# Patient Record
Sex: Male | Born: 1945 | Race: White | Marital: Single | Smoking: Former smoker
Health system: Southern US, Community
[De-identification: ages and names within clinical notes are randomized; demographics above are authoritative.]

## PROBLEM LIST (undated history)

## (undated) DIAGNOSIS — J45909 Unspecified asthma, uncomplicated: Secondary | ICD-10-CM

## (undated) DIAGNOSIS — K219 Gastro-esophageal reflux disease without esophagitis: Secondary | ICD-10-CM

## (undated) DIAGNOSIS — C4490 Unspecified malignant neoplasm of skin, unspecified: Secondary | ICD-10-CM

## (undated) DIAGNOSIS — E785 Hyperlipidemia, unspecified: Secondary | ICD-10-CM

## (undated) DIAGNOSIS — Z923 Personal history of irradiation: Secondary | ICD-10-CM

## (undated) DIAGNOSIS — R000 Tachycardia, unspecified: Secondary | ICD-10-CM

## (undated) DIAGNOSIS — C61 Malignant neoplasm of prostate: Secondary | ICD-10-CM

## (undated) DIAGNOSIS — J439 Emphysema, unspecified: Secondary | ICD-10-CM

## (undated) DIAGNOSIS — R0600 Dyspnea, unspecified: Secondary | ICD-10-CM

## (undated) DIAGNOSIS — M1990 Unspecified osteoarthritis, unspecified site: Secondary | ICD-10-CM

## (undated) HISTORY — PX: CLAVICLE SURGERY: SHX598

## (undated) HISTORY — PX: LEG SURGERY: SHX1003

## (undated) HISTORY — PX: PROSTATE BIOPSY: SHX241

## (undated) HISTORY — PX: APPENDECTOMY: SHX54

## (undated) HISTORY — PX: SKIN CANCER EXCISION: SHX779

## (undated) HISTORY — PX: EYE SURGERY: SHX253

---

## 2012-08-14 DIAGNOSIS — Z7982 Long term (current) use of aspirin: Secondary | ICD-10-CM | POA: Diagnosis not present

## 2012-08-14 DIAGNOSIS — Z79899 Other long term (current) drug therapy: Secondary | ICD-10-CM | POA: Diagnosis not present

## 2012-08-14 DIAGNOSIS — G4730 Sleep apnea, unspecified: Secondary | ICD-10-CM | POA: Diagnosis not present

## 2012-08-14 DIAGNOSIS — M25569 Pain in unspecified knee: Secondary | ICD-10-CM | POA: Diagnosis not present

## 2012-08-14 DIAGNOSIS — S82109A Unspecified fracture of upper end of unspecified tibia, initial encounter for closed fracture: Secondary | ICD-10-CM | POA: Diagnosis not present

## 2012-08-14 DIAGNOSIS — J45909 Unspecified asthma, uncomplicated: Secondary | ICD-10-CM | POA: Diagnosis not present

## 2012-08-14 DIAGNOSIS — S82209A Unspecified fracture of shaft of unspecified tibia, initial encounter for closed fracture: Secondary | ICD-10-CM | POA: Diagnosis not present

## 2012-08-14 DIAGNOSIS — M79609 Pain in unspecified limb: Secondary | ICD-10-CM | POA: Diagnosis not present

## 2012-08-14 DIAGNOSIS — K219 Gastro-esophageal reflux disease without esophagitis: Secondary | ICD-10-CM | POA: Diagnosis not present

## 2012-08-14 DIAGNOSIS — W1789XA Other fall from one level to another, initial encounter: Secondary | ICD-10-CM | POA: Diagnosis not present

## 2012-08-29 DIAGNOSIS — M25469 Effusion, unspecified knee: Secondary | ICD-10-CM | POA: Diagnosis not present

## 2012-08-29 DIAGNOSIS — S82109A Unspecified fracture of upper end of unspecified tibia, initial encounter for closed fracture: Secondary | ICD-10-CM | POA: Diagnosis not present

## 2012-08-29 DIAGNOSIS — R609 Edema, unspecified: Secondary | ICD-10-CM | POA: Diagnosis not present

## 2012-08-29 DIAGNOSIS — S82209A Unspecified fracture of shaft of unspecified tibia, initial encounter for closed fracture: Secondary | ICD-10-CM | POA: Diagnosis not present

## 2012-08-29 DIAGNOSIS — M25569 Pain in unspecified knee: Secondary | ICD-10-CM | POA: Diagnosis not present

## 2012-08-30 DIAGNOSIS — M79609 Pain in unspecified limb: Secondary | ICD-10-CM | POA: Diagnosis not present

## 2012-09-06 DIAGNOSIS — M79609 Pain in unspecified limb: Secondary | ICD-10-CM | POA: Diagnosis not present

## 2012-09-13 DIAGNOSIS — M79609 Pain in unspecified limb: Secondary | ICD-10-CM | POA: Diagnosis not present

## 2012-09-20 DIAGNOSIS — M79609 Pain in unspecified limb: Secondary | ICD-10-CM | POA: Diagnosis not present

## 2012-09-26 DIAGNOSIS — M79609 Pain in unspecified limb: Secondary | ICD-10-CM | POA: Diagnosis not present

## 2012-10-02 DIAGNOSIS — M79609 Pain in unspecified limb: Secondary | ICD-10-CM | POA: Diagnosis not present

## 2012-10-17 DIAGNOSIS — S82209A Unspecified fracture of shaft of unspecified tibia, initial encounter for closed fracture: Secondary | ICD-10-CM | POA: Diagnosis not present

## 2012-10-17 DIAGNOSIS — M79609 Pain in unspecified limb: Secondary | ICD-10-CM | POA: Diagnosis not present

## 2012-10-23 DIAGNOSIS — M79609 Pain in unspecified limb: Secondary | ICD-10-CM | POA: Diagnosis not present

## 2012-10-25 DIAGNOSIS — M79609 Pain in unspecified limb: Secondary | ICD-10-CM | POA: Diagnosis not present

## 2014-02-25 DIAGNOSIS — Z86010 Personal history of colonic polyps: Secondary | ICD-10-CM | POA: Diagnosis not present

## 2014-02-25 DIAGNOSIS — K219 Gastro-esophageal reflux disease without esophagitis: Secondary | ICD-10-CM | POA: Diagnosis not present

## 2014-03-21 DIAGNOSIS — D126 Benign neoplasm of colon, unspecified: Secondary | ICD-10-CM | POA: Diagnosis not present

## 2014-03-21 DIAGNOSIS — K5730 Diverticulosis of large intestine without perforation or abscess without bleeding: Secondary | ICD-10-CM | POA: Diagnosis not present

## 2014-03-21 DIAGNOSIS — Z86010 Personal history of colonic polyps: Secondary | ICD-10-CM | POA: Diagnosis not present

## 2014-09-08 DIAGNOSIS — IMO0002 Reserved for concepts with insufficient information to code with codable children: Secondary | ICD-10-CM | POA: Diagnosis not present

## 2014-09-08 DIAGNOSIS — S335XXA Sprain of ligaments of lumbar spine, initial encounter: Secondary | ICD-10-CM | POA: Diagnosis not present

## 2015-02-19 DIAGNOSIS — H81399 Other peripheral vertigo, unspecified ear: Secondary | ICD-10-CM | POA: Diagnosis not present

## 2015-04-02 DIAGNOSIS — L570 Actinic keratosis: Secondary | ICD-10-CM | POA: Diagnosis not present

## 2015-04-02 DIAGNOSIS — L719 Rosacea, unspecified: Secondary | ICD-10-CM | POA: Diagnosis not present

## 2015-04-02 DIAGNOSIS — D044 Carcinoma in situ of skin of scalp and neck: Secondary | ICD-10-CM | POA: Diagnosis not present

## 2015-10-29 DIAGNOSIS — L570 Actinic keratosis: Secondary | ICD-10-CM | POA: Diagnosis not present

## 2015-10-29 DIAGNOSIS — L821 Other seborrheic keratosis: Secondary | ICD-10-CM | POA: Diagnosis not present

## 2015-12-23 DIAGNOSIS — J209 Acute bronchitis, unspecified: Secondary | ICD-10-CM | POA: Diagnosis not present

## 2015-12-23 DIAGNOSIS — J019 Acute sinusitis, unspecified: Secondary | ICD-10-CM | POA: Diagnosis not present

## 2016-01-14 DIAGNOSIS — Z1389 Encounter for screening for other disorder: Secondary | ICD-10-CM | POA: Diagnosis not present

## 2016-01-14 DIAGNOSIS — R030 Elevated blood-pressure reading, without diagnosis of hypertension: Secondary | ICD-10-CM | POA: Diagnosis not present

## 2016-01-14 DIAGNOSIS — Z6826 Body mass index (BMI) 26.0-26.9, adult: Secondary | ICD-10-CM | POA: Diagnosis not present

## 2016-01-14 DIAGNOSIS — Z9181 History of falling: Secondary | ICD-10-CM | POA: Diagnosis not present

## 2016-01-14 DIAGNOSIS — R1030 Lower abdominal pain, unspecified: Secondary | ICD-10-CM | POA: Diagnosis not present

## 2016-01-29 DIAGNOSIS — R030 Elevated blood-pressure reading, without diagnosis of hypertension: Secondary | ICD-10-CM | POA: Diagnosis not present

## 2016-01-29 DIAGNOSIS — Z1389 Encounter for screening for other disorder: Secondary | ICD-10-CM | POA: Diagnosis not present

## 2016-01-29 DIAGNOSIS — Z125 Encounter for screening for malignant neoplasm of prostate: Secondary | ICD-10-CM | POA: Diagnosis not present

## 2016-01-29 DIAGNOSIS — Z0000 Encounter for general adult medical examination without abnormal findings: Secondary | ICD-10-CM | POA: Diagnosis not present

## 2016-01-29 DIAGNOSIS — Z6826 Body mass index (BMI) 26.0-26.9, adult: Secondary | ICD-10-CM | POA: Diagnosis not present

## 2016-01-29 DIAGNOSIS — E785 Hyperlipidemia, unspecified: Secondary | ICD-10-CM | POA: Diagnosis not present

## 2016-01-29 DIAGNOSIS — Z23 Encounter for immunization: Secondary | ICD-10-CM | POA: Diagnosis not present

## 2016-02-12 DIAGNOSIS — R972 Elevated prostate specific antigen [PSA]: Secondary | ICD-10-CM | POA: Diagnosis not present

## 2016-02-12 DIAGNOSIS — Z8042 Family history of malignant neoplasm of prostate: Secondary | ICD-10-CM | POA: Diagnosis not present

## 2016-02-12 DIAGNOSIS — N401 Enlarged prostate with lower urinary tract symptoms: Secondary | ICD-10-CM | POA: Diagnosis not present

## 2016-02-13 DIAGNOSIS — R51 Headache: Secondary | ICD-10-CM | POA: Diagnosis not present

## 2016-02-13 DIAGNOSIS — H81399 Other peripheral vertigo, unspecified ear: Secondary | ICD-10-CM | POA: Diagnosis not present

## 2016-02-16 DIAGNOSIS — R42 Dizziness and giddiness: Secondary | ICD-10-CM | POA: Diagnosis not present

## 2016-02-16 DIAGNOSIS — G3189 Other specified degenerative diseases of nervous system: Secondary | ICD-10-CM | POA: Diagnosis not present

## 2016-02-16 DIAGNOSIS — J0100 Acute maxillary sinusitis, unspecified: Secondary | ICD-10-CM | POA: Diagnosis not present

## 2016-02-16 DIAGNOSIS — R51 Headache: Secondary | ICD-10-CM | POA: Diagnosis not present

## 2016-02-18 DIAGNOSIS — Z6827 Body mass index (BMI) 27.0-27.9, adult: Secondary | ICD-10-CM | POA: Diagnosis not present

## 2016-02-18 DIAGNOSIS — R42 Dizziness and giddiness: Secondary | ICD-10-CM | POA: Diagnosis not present

## 2016-02-23 DIAGNOSIS — R42 Dizziness and giddiness: Secondary | ICD-10-CM | POA: Diagnosis not present

## 2016-03-17 DIAGNOSIS — N401 Enlarged prostate with lower urinary tract symptoms: Secondary | ICD-10-CM | POA: Diagnosis not present

## 2016-03-17 DIAGNOSIS — R972 Elevated prostate specific antigen [PSA]: Secondary | ICD-10-CM | POA: Diagnosis not present

## 2016-03-30 DIAGNOSIS — J111 Influenza due to unidentified influenza virus with other respiratory manifestations: Secondary | ICD-10-CM | POA: Diagnosis not present

## 2016-04-28 DIAGNOSIS — L570 Actinic keratosis: Secondary | ICD-10-CM | POA: Diagnosis not present

## 2016-04-28 DIAGNOSIS — D485 Neoplasm of uncertain behavior of skin: Secondary | ICD-10-CM | POA: Diagnosis not present

## 2016-07-18 DIAGNOSIS — N401 Enlarged prostate with lower urinary tract symptoms: Secondary | ICD-10-CM | POA: Diagnosis not present

## 2016-07-18 DIAGNOSIS — R972 Elevated prostate specific antigen [PSA]: Secondary | ICD-10-CM | POA: Diagnosis not present

## 2016-10-14 DIAGNOSIS — S2241XA Multiple fractures of ribs, right side, initial encounter for closed fracture: Secondary | ICD-10-CM | POA: Diagnosis not present

## 2016-10-14 DIAGNOSIS — T148XXA Other injury of unspecified body region, initial encounter: Secondary | ICD-10-CM | POA: Diagnosis not present

## 2016-10-14 DIAGNOSIS — S42001A Fracture of unspecified part of right clavicle, initial encounter for closed fracture: Secondary | ICD-10-CM | POA: Diagnosis not present

## 2016-10-14 DIAGNOSIS — S42021A Displaced fracture of shaft of right clavicle, initial encounter for closed fracture: Secondary | ICD-10-CM | POA: Diagnosis not present

## 2016-10-24 DIAGNOSIS — S42001A Fracture of unspecified part of right clavicle, initial encounter for closed fracture: Secondary | ICD-10-CM | POA: Diagnosis not present

## 2016-10-26 DIAGNOSIS — E663 Overweight: Secondary | ICD-10-CM | POA: Diagnosis not present

## 2016-10-26 DIAGNOSIS — Z6827 Body mass index (BMI) 27.0-27.9, adult: Secondary | ICD-10-CM | POA: Diagnosis not present

## 2016-10-26 DIAGNOSIS — Z01810 Encounter for preprocedural cardiovascular examination: Secondary | ICD-10-CM | POA: Diagnosis not present

## 2016-10-26 DIAGNOSIS — E785 Hyperlipidemia, unspecified: Secondary | ICD-10-CM | POA: Diagnosis not present

## 2016-10-26 DIAGNOSIS — S2241XB Multiple fractures of ribs, right side, initial encounter for open fracture: Secondary | ICD-10-CM | POA: Diagnosis not present

## 2016-10-26 DIAGNOSIS — S42001A Fracture of unspecified part of right clavicle, initial encounter for closed fracture: Secondary | ICD-10-CM | POA: Diagnosis not present

## 2016-11-03 DIAGNOSIS — J45909 Unspecified asthma, uncomplicated: Secondary | ICD-10-CM | POA: Diagnosis not present

## 2016-11-03 DIAGNOSIS — M65312 Trigger thumb, left thumb: Secondary | ICD-10-CM | POA: Diagnosis not present

## 2016-11-03 DIAGNOSIS — K219 Gastro-esophageal reflux disease without esophagitis: Secondary | ICD-10-CM | POA: Diagnosis not present

## 2016-11-03 DIAGNOSIS — S42001A Fracture of unspecified part of right clavicle, initial encounter for closed fracture: Secondary | ICD-10-CM | POA: Diagnosis not present

## 2016-11-03 DIAGNOSIS — Z87891 Personal history of nicotine dependence: Secondary | ICD-10-CM | POA: Diagnosis not present

## 2016-11-03 DIAGNOSIS — Z01810 Encounter for preprocedural cardiovascular examination: Secondary | ICD-10-CM | POA: Diagnosis not present

## 2016-11-03 DIAGNOSIS — Z79899 Other long term (current) drug therapy: Secondary | ICD-10-CM | POA: Diagnosis not present

## 2016-11-03 DIAGNOSIS — G8918 Other acute postprocedural pain: Secondary | ICD-10-CM | POA: Diagnosis not present

## 2016-11-03 DIAGNOSIS — S42021A Displaced fracture of shaft of right clavicle, initial encounter for closed fracture: Secondary | ICD-10-CM | POA: Diagnosis not present

## 2016-11-03 DIAGNOSIS — G4733 Obstructive sleep apnea (adult) (pediatric): Secondary | ICD-10-CM | POA: Diagnosis not present

## 2016-11-21 DIAGNOSIS — S42001D Fracture of unspecified part of right clavicle, subsequent encounter for fracture with routine healing: Secondary | ICD-10-CM | POA: Diagnosis not present

## 2016-12-05 DIAGNOSIS — S42001D Fracture of unspecified part of right clavicle, subsequent encounter for fracture with routine healing: Secondary | ICD-10-CM | POA: Diagnosis not present

## 2016-12-08 DIAGNOSIS — M79621 Pain in right upper arm: Secondary | ICD-10-CM | POA: Diagnosis not present

## 2016-12-20 DIAGNOSIS — M25511 Pain in right shoulder: Secondary | ICD-10-CM | POA: Diagnosis not present

## 2016-12-20 DIAGNOSIS — M25611 Stiffness of right shoulder, not elsewhere classified: Secondary | ICD-10-CM | POA: Diagnosis not present

## 2017-01-02 DIAGNOSIS — M25611 Stiffness of right shoulder, not elsewhere classified: Secondary | ICD-10-CM | POA: Diagnosis not present

## 2017-01-02 DIAGNOSIS — M25511 Pain in right shoulder: Secondary | ICD-10-CM | POA: Diagnosis not present

## 2017-01-04 DIAGNOSIS — M25511 Pain in right shoulder: Secondary | ICD-10-CM | POA: Diagnosis not present

## 2017-01-04 DIAGNOSIS — M25611 Stiffness of right shoulder, not elsewhere classified: Secondary | ICD-10-CM | POA: Diagnosis not present

## 2017-01-05 DIAGNOSIS — M25511 Pain in right shoulder: Secondary | ICD-10-CM | POA: Diagnosis not present

## 2017-01-05 DIAGNOSIS — M25611 Stiffness of right shoulder, not elsewhere classified: Secondary | ICD-10-CM | POA: Diagnosis not present

## 2017-01-06 DIAGNOSIS — S42001D Fracture of unspecified part of right clavicle, subsequent encounter for fracture with routine healing: Secondary | ICD-10-CM | POA: Diagnosis not present

## 2017-01-09 DIAGNOSIS — M25511 Pain in right shoulder: Secondary | ICD-10-CM | POA: Diagnosis not present

## 2017-01-09 DIAGNOSIS — M25611 Stiffness of right shoulder, not elsewhere classified: Secondary | ICD-10-CM | POA: Diagnosis not present

## 2017-01-16 DIAGNOSIS — M25511 Pain in right shoulder: Secondary | ICD-10-CM | POA: Diagnosis not present

## 2017-01-16 DIAGNOSIS — M25611 Stiffness of right shoulder, not elsewhere classified: Secondary | ICD-10-CM | POA: Diagnosis not present

## 2017-01-18 DIAGNOSIS — N401 Enlarged prostate with lower urinary tract symptoms: Secondary | ICD-10-CM | POA: Diagnosis not present

## 2017-01-18 DIAGNOSIS — R972 Elevated prostate specific antigen [PSA]: Secondary | ICD-10-CM | POA: Diagnosis not present

## 2017-01-20 DIAGNOSIS — M25611 Stiffness of right shoulder, not elsewhere classified: Secondary | ICD-10-CM | POA: Diagnosis not present

## 2017-01-20 DIAGNOSIS — M25511 Pain in right shoulder: Secondary | ICD-10-CM | POA: Diagnosis not present

## 2017-01-23 DIAGNOSIS — M25511 Pain in right shoulder: Secondary | ICD-10-CM | POA: Diagnosis not present

## 2017-01-23 DIAGNOSIS — M25611 Stiffness of right shoulder, not elsewhere classified: Secondary | ICD-10-CM | POA: Diagnosis not present

## 2017-01-25 DIAGNOSIS — M25611 Stiffness of right shoulder, not elsewhere classified: Secondary | ICD-10-CM | POA: Diagnosis not present

## 2017-01-25 DIAGNOSIS — M25511 Pain in right shoulder: Secondary | ICD-10-CM | POA: Diagnosis not present

## 2017-01-30 DIAGNOSIS — M25611 Stiffness of right shoulder, not elsewhere classified: Secondary | ICD-10-CM | POA: Diagnosis not present

## 2017-01-30 DIAGNOSIS — M25511 Pain in right shoulder: Secondary | ICD-10-CM | POA: Diagnosis not present

## 2017-02-01 DIAGNOSIS — M25611 Stiffness of right shoulder, not elsewhere classified: Secondary | ICD-10-CM | POA: Diagnosis not present

## 2017-02-01 DIAGNOSIS — M25511 Pain in right shoulder: Secondary | ICD-10-CM | POA: Diagnosis not present

## 2017-02-03 DIAGNOSIS — S42001D Fracture of unspecified part of right clavicle, subsequent encounter for fracture with routine healing: Secondary | ICD-10-CM | POA: Diagnosis not present

## 2017-02-08 DIAGNOSIS — M25511 Pain in right shoulder: Secondary | ICD-10-CM | POA: Diagnosis not present

## 2017-02-08 DIAGNOSIS — M25611 Stiffness of right shoulder, not elsewhere classified: Secondary | ICD-10-CM | POA: Diagnosis not present

## 2017-02-13 DIAGNOSIS — M25511 Pain in right shoulder: Secondary | ICD-10-CM | POA: Diagnosis not present

## 2017-02-13 DIAGNOSIS — M25611 Stiffness of right shoulder, not elsewhere classified: Secondary | ICD-10-CM | POA: Diagnosis not present

## 2017-02-24 DIAGNOSIS — S42001D Fracture of unspecified part of right clavicle, subsequent encounter for fracture with routine healing: Secondary | ICD-10-CM | POA: Diagnosis not present

## 2017-04-04 DIAGNOSIS — Z9181 History of falling: Secondary | ICD-10-CM | POA: Diagnosis not present

## 2017-04-04 DIAGNOSIS — Z1389 Encounter for screening for other disorder: Secondary | ICD-10-CM | POA: Diagnosis not present

## 2017-04-04 DIAGNOSIS — E785 Hyperlipidemia, unspecified: Secondary | ICD-10-CM | POA: Diagnosis not present

## 2017-04-04 DIAGNOSIS — Z0000 Encounter for general adult medical examination without abnormal findings: Secondary | ICD-10-CM | POA: Diagnosis not present

## 2017-04-04 DIAGNOSIS — Z136 Encounter for screening for cardiovascular disorders: Secondary | ICD-10-CM | POA: Diagnosis not present

## 2017-05-22 DIAGNOSIS — S20219A Contusion of unspecified front wall of thorax, initial encounter: Secondary | ICD-10-CM | POA: Diagnosis not present

## 2017-05-24 DIAGNOSIS — J61 Pneumoconiosis due to asbestos and other mineral fibers: Secondary | ICD-10-CM | POA: Diagnosis not present

## 2017-05-24 DIAGNOSIS — Z79899 Other long term (current) drug therapy: Secondary | ICD-10-CM | POA: Diagnosis not present

## 2017-05-24 DIAGNOSIS — R918 Other nonspecific abnormal finding of lung field: Secondary | ICD-10-CM | POA: Diagnosis not present

## 2017-05-26 DIAGNOSIS — R918 Other nonspecific abnormal finding of lung field: Secondary | ICD-10-CM | POA: Diagnosis not present

## 2017-05-26 DIAGNOSIS — K8020 Calculus of gallbladder without cholecystitis without obstruction: Secondary | ICD-10-CM | POA: Diagnosis not present

## 2017-05-26 DIAGNOSIS — K449 Diaphragmatic hernia without obstruction or gangrene: Secondary | ICD-10-CM | POA: Diagnosis not present

## 2017-07-19 DIAGNOSIS — R972 Elevated prostate specific antigen [PSA]: Secondary | ICD-10-CM | POA: Diagnosis not present

## 2017-07-19 DIAGNOSIS — N401 Enlarged prostate with lower urinary tract symptoms: Secondary | ICD-10-CM | POA: Diagnosis not present

## 2018-01-22 DIAGNOSIS — N401 Enlarged prostate with lower urinary tract symptoms: Secondary | ICD-10-CM | POA: Diagnosis not present

## 2018-01-22 DIAGNOSIS — R972 Elevated prostate specific antigen [PSA]: Secondary | ICD-10-CM | POA: Diagnosis not present

## 2018-02-26 DIAGNOSIS — N401 Enlarged prostate with lower urinary tract symptoms: Secondary | ICD-10-CM | POA: Diagnosis not present

## 2018-02-26 DIAGNOSIS — D075 Carcinoma in situ of prostate: Secondary | ICD-10-CM | POA: Diagnosis not present

## 2018-02-26 DIAGNOSIS — R972 Elevated prostate specific antigen [PSA]: Secondary | ICD-10-CM | POA: Diagnosis not present

## 2018-03-05 DIAGNOSIS — N401 Enlarged prostate with lower urinary tract symptoms: Secondary | ICD-10-CM | POA: Diagnosis not present

## 2018-03-05 DIAGNOSIS — R972 Elevated prostate specific antigen [PSA]: Secondary | ICD-10-CM | POA: Diagnosis not present

## 2018-03-15 DIAGNOSIS — H43811 Vitreous degeneration, right eye: Secondary | ICD-10-CM | POA: Diagnosis not present

## 2018-03-15 DIAGNOSIS — H35371 Puckering of macula, right eye: Secondary | ICD-10-CM | POA: Diagnosis not present

## 2018-03-15 DIAGNOSIS — H2513 Age-related nuclear cataract, bilateral: Secondary | ICD-10-CM | POA: Diagnosis not present

## 2018-04-03 DIAGNOSIS — L259 Unspecified contact dermatitis, unspecified cause: Secondary | ICD-10-CM | POA: Diagnosis not present

## 2018-04-05 DIAGNOSIS — Z48810 Encounter for surgical aftercare following surgery on the sense organs: Secondary | ICD-10-CM | POA: Diagnosis not present

## 2018-04-05 DIAGNOSIS — H35371 Puckering of macula, right eye: Secondary | ICD-10-CM | POA: Diagnosis not present

## 2018-04-05 DIAGNOSIS — H43811 Vitreous degeneration, right eye: Secondary | ICD-10-CM | POA: Diagnosis not present

## 2018-06-18 DIAGNOSIS — R972 Elevated prostate specific antigen [PSA]: Secondary | ICD-10-CM | POA: Diagnosis not present

## 2018-06-18 DIAGNOSIS — N401 Enlarged prostate with lower urinary tract symptoms: Secondary | ICD-10-CM | POA: Diagnosis not present

## 2018-07-25 ENCOUNTER — Other Ambulatory Visit: Payer: Self-pay

## 2018-08-13 DIAGNOSIS — Z1331 Encounter for screening for depression: Secondary | ICD-10-CM | POA: Diagnosis not present

## 2018-08-13 DIAGNOSIS — Z125 Encounter for screening for malignant neoplasm of prostate: Secondary | ICD-10-CM | POA: Diagnosis not present

## 2018-08-13 DIAGNOSIS — Z9181 History of falling: Secondary | ICD-10-CM | POA: Diagnosis not present

## 2018-08-13 DIAGNOSIS — E785 Hyperlipidemia, unspecified: Secondary | ICD-10-CM | POA: Diagnosis not present

## 2018-08-13 DIAGNOSIS — Z136 Encounter for screening for cardiovascular disorders: Secondary | ICD-10-CM | POA: Diagnosis not present

## 2018-08-13 DIAGNOSIS — Z0000 Encounter for general adult medical examination without abnormal findings: Secondary | ICD-10-CM | POA: Diagnosis not present

## 2018-10-25 DIAGNOSIS — M9902 Segmental and somatic dysfunction of thoracic region: Secondary | ICD-10-CM | POA: Diagnosis not present

## 2018-10-25 DIAGNOSIS — M62830 Muscle spasm of back: Secondary | ICD-10-CM | POA: Diagnosis not present

## 2018-10-25 DIAGNOSIS — M9901 Segmental and somatic dysfunction of cervical region: Secondary | ICD-10-CM | POA: Diagnosis not present

## 2018-10-25 DIAGNOSIS — M5413 Radiculopathy, cervicothoracic region: Secondary | ICD-10-CM | POA: Diagnosis not present

## 2018-10-25 DIAGNOSIS — M62838 Other muscle spasm: Secondary | ICD-10-CM | POA: Diagnosis not present

## 2018-10-25 DIAGNOSIS — M50323 Other cervical disc degeneration at C6-C7 level: Secondary | ICD-10-CM | POA: Diagnosis not present

## 2018-10-25 DIAGNOSIS — M546 Pain in thoracic spine: Secondary | ICD-10-CM | POA: Diagnosis not present

## 2018-10-26 DIAGNOSIS — M5413 Radiculopathy, cervicothoracic region: Secondary | ICD-10-CM | POA: Diagnosis not present

## 2018-10-26 DIAGNOSIS — N401 Enlarged prostate with lower urinary tract symptoms: Secondary | ICD-10-CM | POA: Diagnosis not present

## 2018-10-26 DIAGNOSIS — R972 Elevated prostate specific antigen [PSA]: Secondary | ICD-10-CM | POA: Diagnosis not present

## 2018-10-26 DIAGNOSIS — M9902 Segmental and somatic dysfunction of thoracic region: Secondary | ICD-10-CM | POA: Diagnosis not present

## 2018-10-26 DIAGNOSIS — M546 Pain in thoracic spine: Secondary | ICD-10-CM | POA: Diagnosis not present

## 2018-10-26 DIAGNOSIS — M50323 Other cervical disc degeneration at C6-C7 level: Secondary | ICD-10-CM | POA: Diagnosis not present

## 2018-10-26 DIAGNOSIS — M62830 Muscle spasm of back: Secondary | ICD-10-CM | POA: Diagnosis not present

## 2018-10-26 DIAGNOSIS — M9901 Segmental and somatic dysfunction of cervical region: Secondary | ICD-10-CM | POA: Diagnosis not present

## 2018-10-31 DIAGNOSIS — M546 Pain in thoracic spine: Secondary | ICD-10-CM | POA: Diagnosis not present

## 2018-10-31 DIAGNOSIS — M5413 Radiculopathy, cervicothoracic region: Secondary | ICD-10-CM | POA: Diagnosis not present

## 2018-10-31 DIAGNOSIS — M9901 Segmental and somatic dysfunction of cervical region: Secondary | ICD-10-CM | POA: Diagnosis not present

## 2018-10-31 DIAGNOSIS — M62830 Muscle spasm of back: Secondary | ICD-10-CM | POA: Diagnosis not present

## 2018-10-31 DIAGNOSIS — M50323 Other cervical disc degeneration at C6-C7 level: Secondary | ICD-10-CM | POA: Diagnosis not present

## 2018-10-31 DIAGNOSIS — M9902 Segmental and somatic dysfunction of thoracic region: Secondary | ICD-10-CM | POA: Diagnosis not present

## 2018-11-01 DIAGNOSIS — M62830 Muscle spasm of back: Secondary | ICD-10-CM | POA: Diagnosis not present

## 2018-11-01 DIAGNOSIS — M546 Pain in thoracic spine: Secondary | ICD-10-CM | POA: Diagnosis not present

## 2018-11-01 DIAGNOSIS — M9901 Segmental and somatic dysfunction of cervical region: Secondary | ICD-10-CM | POA: Diagnosis not present

## 2018-11-01 DIAGNOSIS — M50323 Other cervical disc degeneration at C6-C7 level: Secondary | ICD-10-CM | POA: Diagnosis not present

## 2018-11-01 DIAGNOSIS — M5413 Radiculopathy, cervicothoracic region: Secondary | ICD-10-CM | POA: Diagnosis not present

## 2018-11-01 DIAGNOSIS — M9902 Segmental and somatic dysfunction of thoracic region: Secondary | ICD-10-CM | POA: Diagnosis not present

## 2018-11-02 DIAGNOSIS — M546 Pain in thoracic spine: Secondary | ICD-10-CM | POA: Diagnosis not present

## 2018-11-02 DIAGNOSIS — M50323 Other cervical disc degeneration at C6-C7 level: Secondary | ICD-10-CM | POA: Diagnosis not present

## 2018-11-02 DIAGNOSIS — M5413 Radiculopathy, cervicothoracic region: Secondary | ICD-10-CM | POA: Diagnosis not present

## 2018-11-02 DIAGNOSIS — M9902 Segmental and somatic dysfunction of thoracic region: Secondary | ICD-10-CM | POA: Diagnosis not present

## 2018-11-02 DIAGNOSIS — M62830 Muscle spasm of back: Secondary | ICD-10-CM | POA: Diagnosis not present

## 2018-11-02 DIAGNOSIS — M9901 Segmental and somatic dysfunction of cervical region: Secondary | ICD-10-CM | POA: Diagnosis not present

## 2018-11-05 DIAGNOSIS — M50323 Other cervical disc degeneration at C6-C7 level: Secondary | ICD-10-CM | POA: Diagnosis not present

## 2018-11-05 DIAGNOSIS — M62830 Muscle spasm of back: Secondary | ICD-10-CM | POA: Diagnosis not present

## 2018-11-05 DIAGNOSIS — M5413 Radiculopathy, cervicothoracic region: Secondary | ICD-10-CM | POA: Diagnosis not present

## 2018-11-05 DIAGNOSIS — M9902 Segmental and somatic dysfunction of thoracic region: Secondary | ICD-10-CM | POA: Diagnosis not present

## 2018-11-05 DIAGNOSIS — M546 Pain in thoracic spine: Secondary | ICD-10-CM | POA: Diagnosis not present

## 2018-11-05 DIAGNOSIS — M9901 Segmental and somatic dysfunction of cervical region: Secondary | ICD-10-CM | POA: Diagnosis not present

## 2018-11-07 DIAGNOSIS — M5413 Radiculopathy, cervicothoracic region: Secondary | ICD-10-CM | POA: Diagnosis not present

## 2018-11-07 DIAGNOSIS — M62830 Muscle spasm of back: Secondary | ICD-10-CM | POA: Diagnosis not present

## 2018-11-07 DIAGNOSIS — M50323 Other cervical disc degeneration at C6-C7 level: Secondary | ICD-10-CM | POA: Diagnosis not present

## 2018-11-07 DIAGNOSIS — M546 Pain in thoracic spine: Secondary | ICD-10-CM | POA: Diagnosis not present

## 2018-11-07 DIAGNOSIS — M9902 Segmental and somatic dysfunction of thoracic region: Secondary | ICD-10-CM | POA: Diagnosis not present

## 2018-11-07 DIAGNOSIS — M9901 Segmental and somatic dysfunction of cervical region: Secondary | ICD-10-CM | POA: Diagnosis not present

## 2018-11-08 DIAGNOSIS — M9901 Segmental and somatic dysfunction of cervical region: Secondary | ICD-10-CM | POA: Diagnosis not present

## 2018-11-08 DIAGNOSIS — M50323 Other cervical disc degeneration at C6-C7 level: Secondary | ICD-10-CM | POA: Diagnosis not present

## 2018-11-08 DIAGNOSIS — M62830 Muscle spasm of back: Secondary | ICD-10-CM | POA: Diagnosis not present

## 2018-11-08 DIAGNOSIS — M5413 Radiculopathy, cervicothoracic region: Secondary | ICD-10-CM | POA: Diagnosis not present

## 2018-11-08 DIAGNOSIS — M9902 Segmental and somatic dysfunction of thoracic region: Secondary | ICD-10-CM | POA: Diagnosis not present

## 2018-11-08 DIAGNOSIS — M546 Pain in thoracic spine: Secondary | ICD-10-CM | POA: Diagnosis not present

## 2018-11-09 DIAGNOSIS — M5413 Radiculopathy, cervicothoracic region: Secondary | ICD-10-CM | POA: Diagnosis not present

## 2018-11-09 DIAGNOSIS — M9901 Segmental and somatic dysfunction of cervical region: Secondary | ICD-10-CM | POA: Diagnosis not present

## 2018-11-09 DIAGNOSIS — M546 Pain in thoracic spine: Secondary | ICD-10-CM | POA: Diagnosis not present

## 2018-11-09 DIAGNOSIS — M62830 Muscle spasm of back: Secondary | ICD-10-CM | POA: Diagnosis not present

## 2018-11-09 DIAGNOSIS — M50323 Other cervical disc degeneration at C6-C7 level: Secondary | ICD-10-CM | POA: Diagnosis not present

## 2018-11-09 DIAGNOSIS — M9902 Segmental and somatic dysfunction of thoracic region: Secondary | ICD-10-CM | POA: Diagnosis not present

## 2018-11-14 DIAGNOSIS — M9902 Segmental and somatic dysfunction of thoracic region: Secondary | ICD-10-CM | POA: Diagnosis not present

## 2018-11-14 DIAGNOSIS — M546 Pain in thoracic spine: Secondary | ICD-10-CM | POA: Diagnosis not present

## 2018-11-14 DIAGNOSIS — M5413 Radiculopathy, cervicothoracic region: Secondary | ICD-10-CM | POA: Diagnosis not present

## 2018-11-14 DIAGNOSIS — M62830 Muscle spasm of back: Secondary | ICD-10-CM | POA: Diagnosis not present

## 2018-11-14 DIAGNOSIS — M50323 Other cervical disc degeneration at C6-C7 level: Secondary | ICD-10-CM | POA: Diagnosis not present

## 2018-11-14 DIAGNOSIS — M9901 Segmental and somatic dysfunction of cervical region: Secondary | ICD-10-CM | POA: Diagnosis not present

## 2018-11-15 DIAGNOSIS — M9901 Segmental and somatic dysfunction of cervical region: Secondary | ICD-10-CM | POA: Diagnosis not present

## 2018-11-15 DIAGNOSIS — M50323 Other cervical disc degeneration at C6-C7 level: Secondary | ICD-10-CM | POA: Diagnosis not present

## 2018-11-15 DIAGNOSIS — M546 Pain in thoracic spine: Secondary | ICD-10-CM | POA: Diagnosis not present

## 2018-11-15 DIAGNOSIS — M5413 Radiculopathy, cervicothoracic region: Secondary | ICD-10-CM | POA: Diagnosis not present

## 2018-11-15 DIAGNOSIS — M9902 Segmental and somatic dysfunction of thoracic region: Secondary | ICD-10-CM | POA: Diagnosis not present

## 2018-11-15 DIAGNOSIS — M62830 Muscle spasm of back: Secondary | ICD-10-CM | POA: Diagnosis not present

## 2018-11-16 DIAGNOSIS — M9901 Segmental and somatic dysfunction of cervical region: Secondary | ICD-10-CM | POA: Diagnosis not present

## 2018-11-16 DIAGNOSIS — M546 Pain in thoracic spine: Secondary | ICD-10-CM | POA: Diagnosis not present

## 2018-11-16 DIAGNOSIS — M50323 Other cervical disc degeneration at C6-C7 level: Secondary | ICD-10-CM | POA: Diagnosis not present

## 2018-11-16 DIAGNOSIS — M62830 Muscle spasm of back: Secondary | ICD-10-CM | POA: Diagnosis not present

## 2018-11-16 DIAGNOSIS — M9902 Segmental and somatic dysfunction of thoracic region: Secondary | ICD-10-CM | POA: Diagnosis not present

## 2018-11-16 DIAGNOSIS — M5413 Radiculopathy, cervicothoracic region: Secondary | ICD-10-CM | POA: Diagnosis not present

## 2018-11-19 DIAGNOSIS — M9901 Segmental and somatic dysfunction of cervical region: Secondary | ICD-10-CM | POA: Diagnosis not present

## 2018-11-19 DIAGNOSIS — M9902 Segmental and somatic dysfunction of thoracic region: Secondary | ICD-10-CM | POA: Diagnosis not present

## 2018-11-19 DIAGNOSIS — M50323 Other cervical disc degeneration at C6-C7 level: Secondary | ICD-10-CM | POA: Diagnosis not present

## 2018-11-19 DIAGNOSIS — M5413 Radiculopathy, cervicothoracic region: Secondary | ICD-10-CM | POA: Diagnosis not present

## 2018-11-19 DIAGNOSIS — M62830 Muscle spasm of back: Secondary | ICD-10-CM | POA: Diagnosis not present

## 2018-11-19 DIAGNOSIS — M546 Pain in thoracic spine: Secondary | ICD-10-CM | POA: Diagnosis not present

## 2018-11-21 DIAGNOSIS — M50323 Other cervical disc degeneration at C6-C7 level: Secondary | ICD-10-CM | POA: Diagnosis not present

## 2018-11-21 DIAGNOSIS — M62830 Muscle spasm of back: Secondary | ICD-10-CM | POA: Diagnosis not present

## 2018-11-21 DIAGNOSIS — M9902 Segmental and somatic dysfunction of thoracic region: Secondary | ICD-10-CM | POA: Diagnosis not present

## 2018-11-21 DIAGNOSIS — M9901 Segmental and somatic dysfunction of cervical region: Secondary | ICD-10-CM | POA: Diagnosis not present

## 2018-11-21 DIAGNOSIS — M5413 Radiculopathy, cervicothoracic region: Secondary | ICD-10-CM | POA: Diagnosis not present

## 2018-11-21 DIAGNOSIS — M546 Pain in thoracic spine: Secondary | ICD-10-CM | POA: Diagnosis not present

## 2018-11-28 DIAGNOSIS — M546 Pain in thoracic spine: Secondary | ICD-10-CM | POA: Diagnosis not present

## 2018-11-28 DIAGNOSIS — M5413 Radiculopathy, cervicothoracic region: Secondary | ICD-10-CM | POA: Diagnosis not present

## 2018-11-28 DIAGNOSIS — M9902 Segmental and somatic dysfunction of thoracic region: Secondary | ICD-10-CM | POA: Diagnosis not present

## 2018-11-28 DIAGNOSIS — M50323 Other cervical disc degeneration at C6-C7 level: Secondary | ICD-10-CM | POA: Diagnosis not present

## 2018-11-28 DIAGNOSIS — M9901 Segmental and somatic dysfunction of cervical region: Secondary | ICD-10-CM | POA: Diagnosis not present

## 2018-11-28 DIAGNOSIS — M62830 Muscle spasm of back: Secondary | ICD-10-CM | POA: Diagnosis not present

## 2018-11-29 DIAGNOSIS — M62830 Muscle spasm of back: Secondary | ICD-10-CM | POA: Diagnosis not present

## 2018-11-29 DIAGNOSIS — M50323 Other cervical disc degeneration at C6-C7 level: Secondary | ICD-10-CM | POA: Diagnosis not present

## 2018-11-29 DIAGNOSIS — M5413 Radiculopathy, cervicothoracic region: Secondary | ICD-10-CM | POA: Diagnosis not present

## 2018-11-29 DIAGNOSIS — M9902 Segmental and somatic dysfunction of thoracic region: Secondary | ICD-10-CM | POA: Diagnosis not present

## 2018-11-29 DIAGNOSIS — M546 Pain in thoracic spine: Secondary | ICD-10-CM | POA: Diagnosis not present

## 2018-11-29 DIAGNOSIS — M9901 Segmental and somatic dysfunction of cervical region: Secondary | ICD-10-CM | POA: Diagnosis not present

## 2018-11-30 DIAGNOSIS — M62830 Muscle spasm of back: Secondary | ICD-10-CM | POA: Diagnosis not present

## 2018-11-30 DIAGNOSIS — M9902 Segmental and somatic dysfunction of thoracic region: Secondary | ICD-10-CM | POA: Diagnosis not present

## 2018-11-30 DIAGNOSIS — M9901 Segmental and somatic dysfunction of cervical region: Secondary | ICD-10-CM | POA: Diagnosis not present

## 2018-11-30 DIAGNOSIS — M50323 Other cervical disc degeneration at C6-C7 level: Secondary | ICD-10-CM | POA: Diagnosis not present

## 2018-11-30 DIAGNOSIS — M546 Pain in thoracic spine: Secondary | ICD-10-CM | POA: Diagnosis not present

## 2018-11-30 DIAGNOSIS — M5413 Radiculopathy, cervicothoracic region: Secondary | ICD-10-CM | POA: Diagnosis not present

## 2018-12-05 DIAGNOSIS — M50323 Other cervical disc degeneration at C6-C7 level: Secondary | ICD-10-CM | POA: Diagnosis not present

## 2018-12-05 DIAGNOSIS — M5413 Radiculopathy, cervicothoracic region: Secondary | ICD-10-CM | POA: Diagnosis not present

## 2018-12-05 DIAGNOSIS — M9902 Segmental and somatic dysfunction of thoracic region: Secondary | ICD-10-CM | POA: Diagnosis not present

## 2018-12-05 DIAGNOSIS — M546 Pain in thoracic spine: Secondary | ICD-10-CM | POA: Diagnosis not present

## 2018-12-05 DIAGNOSIS — M62830 Muscle spasm of back: Secondary | ICD-10-CM | POA: Diagnosis not present

## 2018-12-05 DIAGNOSIS — M9901 Segmental and somatic dysfunction of cervical region: Secondary | ICD-10-CM | POA: Diagnosis not present

## 2019-02-14 DIAGNOSIS — H5203 Hypermetropia, bilateral: Secondary | ICD-10-CM | POA: Diagnosis not present

## 2019-02-14 DIAGNOSIS — H2511 Age-related nuclear cataract, right eye: Secondary | ICD-10-CM | POA: Diagnosis not present

## 2019-02-14 DIAGNOSIS — H43392 Other vitreous opacities, left eye: Secondary | ICD-10-CM | POA: Diagnosis not present

## 2019-02-14 DIAGNOSIS — H35371 Puckering of macula, right eye: Secondary | ICD-10-CM | POA: Diagnosis not present

## 2019-02-14 DIAGNOSIS — D3132 Benign neoplasm of left choroid: Secondary | ICD-10-CM | POA: Diagnosis not present

## 2019-02-27 DIAGNOSIS — R972 Elevated prostate specific antigen [PSA]: Secondary | ICD-10-CM | POA: Diagnosis not present

## 2019-02-27 DIAGNOSIS — N401 Enlarged prostate with lower urinary tract symptoms: Secondary | ICD-10-CM | POA: Diagnosis not present

## 2019-05-15 DIAGNOSIS — M62830 Muscle spasm of back: Secondary | ICD-10-CM | POA: Diagnosis not present

## 2019-05-15 DIAGNOSIS — M50323 Other cervical disc degeneration at C6-C7 level: Secondary | ICD-10-CM | POA: Diagnosis not present

## 2019-05-15 DIAGNOSIS — M9902 Segmental and somatic dysfunction of thoracic region: Secondary | ICD-10-CM | POA: Diagnosis not present

## 2019-05-15 DIAGNOSIS — M9901 Segmental and somatic dysfunction of cervical region: Secondary | ICD-10-CM | POA: Diagnosis not present

## 2019-05-15 DIAGNOSIS — M5413 Radiculopathy, cervicothoracic region: Secondary | ICD-10-CM | POA: Diagnosis not present

## 2019-05-15 DIAGNOSIS — M546 Pain in thoracic spine: Secondary | ICD-10-CM | POA: Diagnosis not present

## 2019-05-16 DIAGNOSIS — M62830 Muscle spasm of back: Secondary | ICD-10-CM | POA: Diagnosis not present

## 2019-05-16 DIAGNOSIS — M9902 Segmental and somatic dysfunction of thoracic region: Secondary | ICD-10-CM | POA: Diagnosis not present

## 2019-05-16 DIAGNOSIS — M546 Pain in thoracic spine: Secondary | ICD-10-CM | POA: Diagnosis not present

## 2019-05-16 DIAGNOSIS — M50323 Other cervical disc degeneration at C6-C7 level: Secondary | ICD-10-CM | POA: Diagnosis not present

## 2019-05-16 DIAGNOSIS — M5413 Radiculopathy, cervicothoracic region: Secondary | ICD-10-CM | POA: Diagnosis not present

## 2019-05-16 DIAGNOSIS — M9901 Segmental and somatic dysfunction of cervical region: Secondary | ICD-10-CM | POA: Diagnosis not present

## 2019-05-17 DIAGNOSIS — M50323 Other cervical disc degeneration at C6-C7 level: Secondary | ICD-10-CM | POA: Diagnosis not present

## 2019-05-17 DIAGNOSIS — M9902 Segmental and somatic dysfunction of thoracic region: Secondary | ICD-10-CM | POA: Diagnosis not present

## 2019-05-17 DIAGNOSIS — M62830 Muscle spasm of back: Secondary | ICD-10-CM | POA: Diagnosis not present

## 2019-05-17 DIAGNOSIS — M9901 Segmental and somatic dysfunction of cervical region: Secondary | ICD-10-CM | POA: Diagnosis not present

## 2019-05-17 DIAGNOSIS — M5413 Radiculopathy, cervicothoracic region: Secondary | ICD-10-CM | POA: Diagnosis not present

## 2019-05-17 DIAGNOSIS — M546 Pain in thoracic spine: Secondary | ICD-10-CM | POA: Diagnosis not present

## 2019-05-21 DIAGNOSIS — M62830 Muscle spasm of back: Secondary | ICD-10-CM | POA: Diagnosis not present

## 2019-05-21 DIAGNOSIS — M9902 Segmental and somatic dysfunction of thoracic region: Secondary | ICD-10-CM | POA: Diagnosis not present

## 2019-05-21 DIAGNOSIS — M50323 Other cervical disc degeneration at C6-C7 level: Secondary | ICD-10-CM | POA: Diagnosis not present

## 2019-05-21 DIAGNOSIS — M546 Pain in thoracic spine: Secondary | ICD-10-CM | POA: Diagnosis not present

## 2019-05-21 DIAGNOSIS — M5413 Radiculopathy, cervicothoracic region: Secondary | ICD-10-CM | POA: Diagnosis not present

## 2019-05-21 DIAGNOSIS — M9901 Segmental and somatic dysfunction of cervical region: Secondary | ICD-10-CM | POA: Diagnosis not present

## 2019-05-22 DIAGNOSIS — M9902 Segmental and somatic dysfunction of thoracic region: Secondary | ICD-10-CM | POA: Diagnosis not present

## 2019-05-22 DIAGNOSIS — M50323 Other cervical disc degeneration at C6-C7 level: Secondary | ICD-10-CM | POA: Diagnosis not present

## 2019-05-22 DIAGNOSIS — M9901 Segmental and somatic dysfunction of cervical region: Secondary | ICD-10-CM | POA: Diagnosis not present

## 2019-05-22 DIAGNOSIS — M546 Pain in thoracic spine: Secondary | ICD-10-CM | POA: Diagnosis not present

## 2019-05-22 DIAGNOSIS — M5413 Radiculopathy, cervicothoracic region: Secondary | ICD-10-CM | POA: Diagnosis not present

## 2019-05-22 DIAGNOSIS — M62830 Muscle spasm of back: Secondary | ICD-10-CM | POA: Diagnosis not present

## 2019-05-24 DIAGNOSIS — M546 Pain in thoracic spine: Secondary | ICD-10-CM | POA: Diagnosis not present

## 2019-05-24 DIAGNOSIS — M62830 Muscle spasm of back: Secondary | ICD-10-CM | POA: Diagnosis not present

## 2019-05-24 DIAGNOSIS — M9902 Segmental and somatic dysfunction of thoracic region: Secondary | ICD-10-CM | POA: Diagnosis not present

## 2019-05-24 DIAGNOSIS — M9901 Segmental and somatic dysfunction of cervical region: Secondary | ICD-10-CM | POA: Diagnosis not present

## 2019-05-24 DIAGNOSIS — M50323 Other cervical disc degeneration at C6-C7 level: Secondary | ICD-10-CM | POA: Diagnosis not present

## 2019-05-24 DIAGNOSIS — M5413 Radiculopathy, cervicothoracic region: Secondary | ICD-10-CM | POA: Diagnosis not present

## 2019-05-27 DIAGNOSIS — M62830 Muscle spasm of back: Secondary | ICD-10-CM | POA: Diagnosis not present

## 2019-05-27 DIAGNOSIS — M50323 Other cervical disc degeneration at C6-C7 level: Secondary | ICD-10-CM | POA: Diagnosis not present

## 2019-05-27 DIAGNOSIS — M546 Pain in thoracic spine: Secondary | ICD-10-CM | POA: Diagnosis not present

## 2019-05-27 DIAGNOSIS — M9902 Segmental and somatic dysfunction of thoracic region: Secondary | ICD-10-CM | POA: Diagnosis not present

## 2019-05-27 DIAGNOSIS — M9901 Segmental and somatic dysfunction of cervical region: Secondary | ICD-10-CM | POA: Diagnosis not present

## 2019-05-27 DIAGNOSIS — M5413 Radiculopathy, cervicothoracic region: Secondary | ICD-10-CM | POA: Diagnosis not present

## 2019-05-29 DIAGNOSIS — M546 Pain in thoracic spine: Secondary | ICD-10-CM | POA: Diagnosis not present

## 2019-05-29 DIAGNOSIS — M50323 Other cervical disc degeneration at C6-C7 level: Secondary | ICD-10-CM | POA: Diagnosis not present

## 2019-05-29 DIAGNOSIS — M9901 Segmental and somatic dysfunction of cervical region: Secondary | ICD-10-CM | POA: Diagnosis not present

## 2019-05-29 DIAGNOSIS — M62830 Muscle spasm of back: Secondary | ICD-10-CM | POA: Diagnosis not present

## 2019-05-29 DIAGNOSIS — M5413 Radiculopathy, cervicothoracic region: Secondary | ICD-10-CM | POA: Diagnosis not present

## 2019-05-29 DIAGNOSIS — M9902 Segmental and somatic dysfunction of thoracic region: Secondary | ICD-10-CM | POA: Diagnosis not present

## 2019-05-31 DIAGNOSIS — M9902 Segmental and somatic dysfunction of thoracic region: Secondary | ICD-10-CM | POA: Diagnosis not present

## 2019-05-31 DIAGNOSIS — M546 Pain in thoracic spine: Secondary | ICD-10-CM | POA: Diagnosis not present

## 2019-05-31 DIAGNOSIS — M50323 Other cervical disc degeneration at C6-C7 level: Secondary | ICD-10-CM | POA: Diagnosis not present

## 2019-05-31 DIAGNOSIS — M62830 Muscle spasm of back: Secondary | ICD-10-CM | POA: Diagnosis not present

## 2019-05-31 DIAGNOSIS — M9901 Segmental and somatic dysfunction of cervical region: Secondary | ICD-10-CM | POA: Diagnosis not present

## 2019-05-31 DIAGNOSIS — M5413 Radiculopathy, cervicothoracic region: Secondary | ICD-10-CM | POA: Diagnosis not present

## 2019-06-03 DIAGNOSIS — M9901 Segmental and somatic dysfunction of cervical region: Secondary | ICD-10-CM | POA: Diagnosis not present

## 2019-06-03 DIAGNOSIS — M50323 Other cervical disc degeneration at C6-C7 level: Secondary | ICD-10-CM | POA: Diagnosis not present

## 2019-06-03 DIAGNOSIS — M5413 Radiculopathy, cervicothoracic region: Secondary | ICD-10-CM | POA: Diagnosis not present

## 2019-06-03 DIAGNOSIS — M546 Pain in thoracic spine: Secondary | ICD-10-CM | POA: Diagnosis not present

## 2019-06-03 DIAGNOSIS — M9902 Segmental and somatic dysfunction of thoracic region: Secondary | ICD-10-CM | POA: Diagnosis not present

## 2019-06-03 DIAGNOSIS — M62830 Muscle spasm of back: Secondary | ICD-10-CM | POA: Diagnosis not present

## 2019-06-05 DIAGNOSIS — M9901 Segmental and somatic dysfunction of cervical region: Secondary | ICD-10-CM | POA: Diagnosis not present

## 2019-06-05 DIAGNOSIS — M50323 Other cervical disc degeneration at C6-C7 level: Secondary | ICD-10-CM | POA: Diagnosis not present

## 2019-06-05 DIAGNOSIS — M5413 Radiculopathy, cervicothoracic region: Secondary | ICD-10-CM | POA: Diagnosis not present

## 2019-06-05 DIAGNOSIS — M9902 Segmental and somatic dysfunction of thoracic region: Secondary | ICD-10-CM | POA: Diagnosis not present

## 2019-06-05 DIAGNOSIS — M546 Pain in thoracic spine: Secondary | ICD-10-CM | POA: Diagnosis not present

## 2019-06-05 DIAGNOSIS — M62830 Muscle spasm of back: Secondary | ICD-10-CM | POA: Diagnosis not present

## 2019-06-07 DIAGNOSIS — M62830 Muscle spasm of back: Secondary | ICD-10-CM | POA: Diagnosis not present

## 2019-06-07 DIAGNOSIS — M546 Pain in thoracic spine: Secondary | ICD-10-CM | POA: Diagnosis not present

## 2019-06-07 DIAGNOSIS — M9901 Segmental and somatic dysfunction of cervical region: Secondary | ICD-10-CM | POA: Diagnosis not present

## 2019-06-07 DIAGNOSIS — M5413 Radiculopathy, cervicothoracic region: Secondary | ICD-10-CM | POA: Diagnosis not present

## 2019-06-07 DIAGNOSIS — M50323 Other cervical disc degeneration at C6-C7 level: Secondary | ICD-10-CM | POA: Diagnosis not present

## 2019-06-07 DIAGNOSIS — M9902 Segmental and somatic dysfunction of thoracic region: Secondary | ICD-10-CM | POA: Diagnosis not present

## 2019-06-10 DIAGNOSIS — M546 Pain in thoracic spine: Secondary | ICD-10-CM | POA: Diagnosis not present

## 2019-06-10 DIAGNOSIS — M9902 Segmental and somatic dysfunction of thoracic region: Secondary | ICD-10-CM | POA: Diagnosis not present

## 2019-06-10 DIAGNOSIS — M9901 Segmental and somatic dysfunction of cervical region: Secondary | ICD-10-CM | POA: Diagnosis not present

## 2019-06-10 DIAGNOSIS — M5413 Radiculopathy, cervicothoracic region: Secondary | ICD-10-CM | POA: Diagnosis not present

## 2019-06-10 DIAGNOSIS — M50323 Other cervical disc degeneration at C6-C7 level: Secondary | ICD-10-CM | POA: Diagnosis not present

## 2019-06-10 DIAGNOSIS — M62830 Muscle spasm of back: Secondary | ICD-10-CM | POA: Diagnosis not present

## 2019-06-12 DIAGNOSIS — M5413 Radiculopathy, cervicothoracic region: Secondary | ICD-10-CM | POA: Diagnosis not present

## 2019-06-12 DIAGNOSIS — M9902 Segmental and somatic dysfunction of thoracic region: Secondary | ICD-10-CM | POA: Diagnosis not present

## 2019-06-12 DIAGNOSIS — M50323 Other cervical disc degeneration at C6-C7 level: Secondary | ICD-10-CM | POA: Diagnosis not present

## 2019-06-12 DIAGNOSIS — M62830 Muscle spasm of back: Secondary | ICD-10-CM | POA: Diagnosis not present

## 2019-06-12 DIAGNOSIS — M546 Pain in thoracic spine: Secondary | ICD-10-CM | POA: Diagnosis not present

## 2019-06-12 DIAGNOSIS — M9901 Segmental and somatic dysfunction of cervical region: Secondary | ICD-10-CM | POA: Diagnosis not present

## 2019-06-14 DIAGNOSIS — M9902 Segmental and somatic dysfunction of thoracic region: Secondary | ICD-10-CM | POA: Diagnosis not present

## 2019-06-14 DIAGNOSIS — M9901 Segmental and somatic dysfunction of cervical region: Secondary | ICD-10-CM | POA: Diagnosis not present

## 2019-06-14 DIAGNOSIS — M62830 Muscle spasm of back: Secondary | ICD-10-CM | POA: Diagnosis not present

## 2019-06-14 DIAGNOSIS — M50323 Other cervical disc degeneration at C6-C7 level: Secondary | ICD-10-CM | POA: Diagnosis not present

## 2019-06-14 DIAGNOSIS — M5413 Radiculopathy, cervicothoracic region: Secondary | ICD-10-CM | POA: Diagnosis not present

## 2019-06-14 DIAGNOSIS — M546 Pain in thoracic spine: Secondary | ICD-10-CM | POA: Diagnosis not present

## 2019-06-17 DIAGNOSIS — M5413 Radiculopathy, cervicothoracic region: Secondary | ICD-10-CM | POA: Diagnosis not present

## 2019-06-17 DIAGNOSIS — M62830 Muscle spasm of back: Secondary | ICD-10-CM | POA: Diagnosis not present

## 2019-06-17 DIAGNOSIS — M50323 Other cervical disc degeneration at C6-C7 level: Secondary | ICD-10-CM | POA: Diagnosis not present

## 2019-06-17 DIAGNOSIS — M9902 Segmental and somatic dysfunction of thoracic region: Secondary | ICD-10-CM | POA: Diagnosis not present

## 2019-06-17 DIAGNOSIS — M546 Pain in thoracic spine: Secondary | ICD-10-CM | POA: Diagnosis not present

## 2019-06-17 DIAGNOSIS — M9901 Segmental and somatic dysfunction of cervical region: Secondary | ICD-10-CM | POA: Diagnosis not present

## 2019-06-19 DIAGNOSIS — M50323 Other cervical disc degeneration at C6-C7 level: Secondary | ICD-10-CM | POA: Diagnosis not present

## 2019-06-19 DIAGNOSIS — M9901 Segmental and somatic dysfunction of cervical region: Secondary | ICD-10-CM | POA: Diagnosis not present

## 2019-06-19 DIAGNOSIS — M546 Pain in thoracic spine: Secondary | ICD-10-CM | POA: Diagnosis not present

## 2019-06-19 DIAGNOSIS — M9902 Segmental and somatic dysfunction of thoracic region: Secondary | ICD-10-CM | POA: Diagnosis not present

## 2019-06-19 DIAGNOSIS — M62830 Muscle spasm of back: Secondary | ICD-10-CM | POA: Diagnosis not present

## 2019-06-19 DIAGNOSIS — M5413 Radiculopathy, cervicothoracic region: Secondary | ICD-10-CM | POA: Diagnosis not present

## 2019-06-21 DIAGNOSIS — M50323 Other cervical disc degeneration at C6-C7 level: Secondary | ICD-10-CM | POA: Diagnosis not present

## 2019-06-21 DIAGNOSIS — M546 Pain in thoracic spine: Secondary | ICD-10-CM | POA: Diagnosis not present

## 2019-06-21 DIAGNOSIS — M9902 Segmental and somatic dysfunction of thoracic region: Secondary | ICD-10-CM | POA: Diagnosis not present

## 2019-06-21 DIAGNOSIS — M62830 Muscle spasm of back: Secondary | ICD-10-CM | POA: Diagnosis not present

## 2019-06-21 DIAGNOSIS — M9901 Segmental and somatic dysfunction of cervical region: Secondary | ICD-10-CM | POA: Diagnosis not present

## 2019-06-21 DIAGNOSIS — M5413 Radiculopathy, cervicothoracic region: Secondary | ICD-10-CM | POA: Diagnosis not present

## 2019-06-24 DIAGNOSIS — M9902 Segmental and somatic dysfunction of thoracic region: Secondary | ICD-10-CM | POA: Diagnosis not present

## 2019-06-24 DIAGNOSIS — M546 Pain in thoracic spine: Secondary | ICD-10-CM | POA: Diagnosis not present

## 2019-06-24 DIAGNOSIS — M62830 Muscle spasm of back: Secondary | ICD-10-CM | POA: Diagnosis not present

## 2019-06-24 DIAGNOSIS — M5413 Radiculopathy, cervicothoracic region: Secondary | ICD-10-CM | POA: Diagnosis not present

## 2019-06-24 DIAGNOSIS — M9901 Segmental and somatic dysfunction of cervical region: Secondary | ICD-10-CM | POA: Diagnosis not present

## 2019-06-24 DIAGNOSIS — M50323 Other cervical disc degeneration at C6-C7 level: Secondary | ICD-10-CM | POA: Diagnosis not present

## 2019-06-26 DIAGNOSIS — M9902 Segmental and somatic dysfunction of thoracic region: Secondary | ICD-10-CM | POA: Diagnosis not present

## 2019-06-26 DIAGNOSIS — M546 Pain in thoracic spine: Secondary | ICD-10-CM | POA: Diagnosis not present

## 2019-06-26 DIAGNOSIS — M5413 Radiculopathy, cervicothoracic region: Secondary | ICD-10-CM | POA: Diagnosis not present

## 2019-06-26 DIAGNOSIS — M50323 Other cervical disc degeneration at C6-C7 level: Secondary | ICD-10-CM | POA: Diagnosis not present

## 2019-06-26 DIAGNOSIS — M5416 Radiculopathy, lumbar region: Secondary | ICD-10-CM | POA: Diagnosis not present

## 2019-06-26 DIAGNOSIS — M62830 Muscle spasm of back: Secondary | ICD-10-CM | POA: Diagnosis not present

## 2019-06-26 DIAGNOSIS — M9901 Segmental and somatic dysfunction of cervical region: Secondary | ICD-10-CM | POA: Diagnosis not present

## 2019-07-03 DIAGNOSIS — M4726 Other spondylosis with radiculopathy, lumbar region: Secondary | ICD-10-CM | POA: Diagnosis not present

## 2019-07-03 DIAGNOSIS — M545 Low back pain: Secondary | ICD-10-CM | POA: Diagnosis not present

## 2019-07-03 DIAGNOSIS — M48061 Spinal stenosis, lumbar region without neurogenic claudication: Secondary | ICD-10-CM | POA: Diagnosis not present

## 2019-07-11 DIAGNOSIS — M545 Low back pain: Secondary | ICD-10-CM | POA: Diagnosis not present

## 2019-07-12 DIAGNOSIS — M48061 Spinal stenosis, lumbar region without neurogenic claudication: Secondary | ICD-10-CM | POA: Diagnosis not present

## 2019-07-12 DIAGNOSIS — M5416 Radiculopathy, lumbar region: Secondary | ICD-10-CM | POA: Diagnosis not present

## 2019-08-15 DIAGNOSIS — Z125 Encounter for screening for malignant neoplasm of prostate: Secondary | ICD-10-CM | POA: Diagnosis not present

## 2019-08-15 DIAGNOSIS — Z1211 Encounter for screening for malignant neoplasm of colon: Secondary | ICD-10-CM | POA: Diagnosis not present

## 2019-08-15 DIAGNOSIS — E785 Hyperlipidemia, unspecified: Secondary | ICD-10-CM | POA: Diagnosis not present

## 2019-08-15 DIAGNOSIS — Z9181 History of falling: Secondary | ICD-10-CM | POA: Diagnosis not present

## 2019-08-15 DIAGNOSIS — Z1331 Encounter for screening for depression: Secondary | ICD-10-CM | POA: Diagnosis not present

## 2019-08-15 DIAGNOSIS — Z139 Encounter for screening, unspecified: Secondary | ICD-10-CM | POA: Diagnosis not present

## 2019-08-15 DIAGNOSIS — Z0000 Encounter for general adult medical examination without abnormal findings: Secondary | ICD-10-CM | POA: Diagnosis not present

## 2019-09-04 DIAGNOSIS — R972 Elevated prostate specific antigen [PSA]: Secondary | ICD-10-CM | POA: Diagnosis not present

## 2019-09-04 DIAGNOSIS — N401 Enlarged prostate with lower urinary tract symptoms: Secondary | ICD-10-CM | POA: Diagnosis not present

## 2019-09-04 DIAGNOSIS — N529 Male erectile dysfunction, unspecified: Secondary | ICD-10-CM | POA: Diagnosis not present

## 2019-09-04 DIAGNOSIS — N486 Induration penis plastica: Secondary | ICD-10-CM | POA: Diagnosis not present

## 2019-10-14 DIAGNOSIS — Z86010 Personal history of colonic polyps: Secondary | ICD-10-CM | POA: Diagnosis not present

## 2019-10-14 DIAGNOSIS — Z1211 Encounter for screening for malignant neoplasm of colon: Secondary | ICD-10-CM | POA: Diagnosis not present

## 2019-12-02 DIAGNOSIS — M5416 Radiculopathy, lumbar region: Secondary | ICD-10-CM | POA: Diagnosis not present

## 2019-12-02 DIAGNOSIS — M4807 Spinal stenosis, lumbosacral region: Secondary | ICD-10-CM | POA: Diagnosis not present

## 2019-12-02 DIAGNOSIS — M47816 Spondylosis without myelopathy or radiculopathy, lumbar region: Secondary | ICD-10-CM | POA: Diagnosis not present

## 2019-12-04 DIAGNOSIS — M48062 Spinal stenosis, lumbar region with neurogenic claudication: Secondary | ICD-10-CM | POA: Diagnosis not present

## 2020-02-05 DIAGNOSIS — M48062 Spinal stenosis, lumbar region with neurogenic claudication: Secondary | ICD-10-CM | POA: Diagnosis not present

## 2020-02-05 DIAGNOSIS — M5416 Radiculopathy, lumbar region: Secondary | ICD-10-CM | POA: Diagnosis not present

## 2020-03-04 DIAGNOSIS — N401 Enlarged prostate with lower urinary tract symptoms: Secondary | ICD-10-CM | POA: Diagnosis not present

## 2020-03-04 DIAGNOSIS — N529 Male erectile dysfunction, unspecified: Secondary | ICD-10-CM | POA: Diagnosis not present

## 2020-03-04 DIAGNOSIS — N486 Induration penis plastica: Secondary | ICD-10-CM | POA: Diagnosis not present

## 2020-03-04 DIAGNOSIS — R972 Elevated prostate specific antigen [PSA]: Secondary | ICD-10-CM | POA: Diagnosis not present

## 2020-07-15 DIAGNOSIS — M48062 Spinal stenosis, lumbar region with neurogenic claudication: Secondary | ICD-10-CM | POA: Diagnosis not present

## 2020-07-16 DIAGNOSIS — M48062 Spinal stenosis, lumbar region with neurogenic claudication: Secondary | ICD-10-CM | POA: Diagnosis not present

## 2020-07-16 DIAGNOSIS — M545 Low back pain: Secondary | ICD-10-CM | POA: Diagnosis not present

## 2020-07-21 DIAGNOSIS — M48062 Spinal stenosis, lumbar region with neurogenic claudication: Secondary | ICD-10-CM | POA: Diagnosis not present

## 2020-07-29 DIAGNOSIS — M48062 Spinal stenosis, lumbar region with neurogenic claudication: Secondary | ICD-10-CM | POA: Diagnosis not present

## 2020-09-09 DIAGNOSIS — Z9181 History of falling: Secondary | ICD-10-CM | POA: Diagnosis not present

## 2020-09-09 DIAGNOSIS — Z139 Encounter for screening, unspecified: Secondary | ICD-10-CM | POA: Diagnosis not present

## 2020-09-09 DIAGNOSIS — Z0000 Encounter for general adult medical examination without abnormal findings: Secondary | ICD-10-CM | POA: Diagnosis not present

## 2020-09-09 DIAGNOSIS — Z1331 Encounter for screening for depression: Secondary | ICD-10-CM | POA: Diagnosis not present

## 2020-09-09 DIAGNOSIS — E785 Hyperlipidemia, unspecified: Secondary | ICD-10-CM | POA: Diagnosis not present

## 2020-10-12 DIAGNOSIS — N529 Male erectile dysfunction, unspecified: Secondary | ICD-10-CM | POA: Diagnosis not present

## 2020-10-12 DIAGNOSIS — N401 Enlarged prostate with lower urinary tract symptoms: Secondary | ICD-10-CM | POA: Diagnosis not present

## 2020-10-12 DIAGNOSIS — N486 Induration penis plastica: Secondary | ICD-10-CM | POA: Diagnosis not present

## 2020-10-12 DIAGNOSIS — R972 Elevated prostate specific antigen [PSA]: Secondary | ICD-10-CM | POA: Diagnosis not present

## 2020-10-23 DIAGNOSIS — Z23 Encounter for immunization: Secondary | ICD-10-CM | POA: Diagnosis not present

## 2021-01-30 DIAGNOSIS — M25552 Pain in left hip: Secondary | ICD-10-CM | POA: Diagnosis not present

## 2021-04-13 DIAGNOSIS — R972 Elevated prostate specific antigen [PSA]: Secondary | ICD-10-CM | POA: Diagnosis not present

## 2021-04-13 DIAGNOSIS — N401 Enlarged prostate with lower urinary tract symptoms: Secondary | ICD-10-CM | POA: Diagnosis not present

## 2021-04-13 DIAGNOSIS — N529 Male erectile dysfunction, unspecified: Secondary | ICD-10-CM | POA: Diagnosis not present

## 2021-04-16 ENCOUNTER — Other Ambulatory Visit: Payer: Self-pay | Admitting: Urology

## 2021-04-16 DIAGNOSIS — R972 Elevated prostate specific antigen [PSA]: Secondary | ICD-10-CM

## 2021-04-21 DIAGNOSIS — Z23 Encounter for immunization: Secondary | ICD-10-CM | POA: Diagnosis not present

## 2021-05-04 ENCOUNTER — Other Ambulatory Visit: Payer: Self-pay

## 2021-05-07 ENCOUNTER — Other Ambulatory Visit: Payer: Self-pay | Admitting: Urology

## 2021-05-07 ENCOUNTER — Ambulatory Visit
Admission: RE | Admit: 2021-05-07 | Discharge: 2021-05-07 | Disposition: A | Discharge status: Home / Self Care | Payer: Medicare Other | Source: Ambulatory Visit | Attending: Urology | Admitting: Urology

## 2021-05-07 ENCOUNTER — Other Ambulatory Visit: Payer: Self-pay

## 2021-05-07 DIAGNOSIS — Z77018 Contact with and (suspected) exposure to other hazardous metals: Secondary | ICD-10-CM

## 2021-05-07 DIAGNOSIS — R972 Elevated prostate specific antigen [PSA]: Secondary | ICD-10-CM | POA: Diagnosis not present

## 2021-05-07 MED ORDER — GADOBENATE DIMEGLUMINE 529 MG/ML IV SOLN
17.0000 mL | Freq: Once | INTRAVENOUS | Status: AC | PRN
  Administered 2021-05-07: 17 mL via INTRAVENOUS

## 2021-05-17 DIAGNOSIS — S61012A Laceration without foreign body of left thumb without damage to nail, initial encounter: Secondary | ICD-10-CM | POA: Diagnosis not present

## 2021-05-19 DIAGNOSIS — S61012A Laceration without foreign body of left thumb without damage to nail, initial encounter: Secondary | ICD-10-CM | POA: Diagnosis not present

## 2021-07-14 DIAGNOSIS — M25552 Pain in left hip: Secondary | ICD-10-CM | POA: Diagnosis not present

## 2021-07-28 DIAGNOSIS — M25552 Pain in left hip: Secondary | ICD-10-CM | POA: Diagnosis not present

## 2021-08-28 DIAGNOSIS — J449 Chronic obstructive pulmonary disease, unspecified: Secondary | ICD-10-CM | POA: Diagnosis not present

## 2021-08-28 DIAGNOSIS — R42 Dizziness and giddiness: Secondary | ICD-10-CM | POA: Diagnosis not present

## 2021-08-28 DIAGNOSIS — R0602 Shortness of breath: Secondary | ICD-10-CM | POA: Diagnosis not present

## 2021-08-28 DIAGNOSIS — R110 Nausea: Secondary | ICD-10-CM | POA: Diagnosis not present

## 2021-09-02 DIAGNOSIS — M25552 Pain in left hip: Secondary | ICD-10-CM | POA: Diagnosis not present

## 2021-09-13 DIAGNOSIS — Z1331 Encounter for screening for depression: Secondary | ICD-10-CM | POA: Diagnosis not present

## 2021-09-13 DIAGNOSIS — Z9181 History of falling: Secondary | ICD-10-CM | POA: Diagnosis not present

## 2021-09-13 DIAGNOSIS — Z0000 Encounter for general adult medical examination without abnormal findings: Secondary | ICD-10-CM | POA: Diagnosis not present

## 2021-09-13 DIAGNOSIS — E785 Hyperlipidemia, unspecified: Secondary | ICD-10-CM | POA: Diagnosis not present

## 2021-09-13 DIAGNOSIS — Z139 Encounter for screening, unspecified: Secondary | ICD-10-CM | POA: Diagnosis not present

## 2021-09-27 DIAGNOSIS — E785 Hyperlipidemia, unspecified: Secondary | ICD-10-CM | POA: Diagnosis not present

## 2021-09-27 DIAGNOSIS — M1612 Unilateral primary osteoarthritis, left hip: Secondary | ICD-10-CM | POA: Diagnosis not present

## 2021-09-27 DIAGNOSIS — J61 Pneumoconiosis due to asbestos and other mineral fibers: Secondary | ICD-10-CM | POA: Diagnosis not present

## 2021-09-27 DIAGNOSIS — N401 Enlarged prostate with lower urinary tract symptoms: Secondary | ICD-10-CM | POA: Diagnosis not present

## 2021-09-27 DIAGNOSIS — R000 Tachycardia, unspecified: Secondary | ICD-10-CM | POA: Diagnosis not present

## 2021-09-27 DIAGNOSIS — I10 Essential (primary) hypertension: Secondary | ICD-10-CM | POA: Diagnosis not present

## 2021-09-27 DIAGNOSIS — Z6828 Body mass index (BMI) 28.0-28.9, adult: Secondary | ICD-10-CM | POA: Diagnosis not present

## 2021-09-27 DIAGNOSIS — K219 Gastro-esophageal reflux disease without esophagitis: Secondary | ICD-10-CM | POA: Diagnosis not present

## 2021-09-27 DIAGNOSIS — K449 Diaphragmatic hernia without obstruction or gangrene: Secondary | ICD-10-CM | POA: Diagnosis not present

## 2021-10-11 DIAGNOSIS — I10 Essential (primary) hypertension: Secondary | ICD-10-CM | POA: Diagnosis not present

## 2021-10-11 DIAGNOSIS — Z6829 Body mass index (BMI) 29.0-29.9, adult: Secondary | ICD-10-CM | POA: Diagnosis not present

## 2021-10-25 DIAGNOSIS — I10 Essential (primary) hypertension: Secondary | ICD-10-CM | POA: Diagnosis not present

## 2021-10-27 DIAGNOSIS — N401 Enlarged prostate with lower urinary tract symptoms: Secondary | ICD-10-CM | POA: Diagnosis not present

## 2021-10-27 DIAGNOSIS — R972 Elevated prostate specific antigen [PSA]: Secondary | ICD-10-CM | POA: Diagnosis not present

## 2021-10-27 DIAGNOSIS — N529 Male erectile dysfunction, unspecified: Secondary | ICD-10-CM | POA: Diagnosis not present

## 2021-11-01 DIAGNOSIS — C61 Malignant neoplasm of prostate: Secondary | ICD-10-CM | POA: Diagnosis not present

## 2021-11-01 DIAGNOSIS — R972 Elevated prostate specific antigen [PSA]: Secondary | ICD-10-CM | POA: Diagnosis not present

## 2021-11-01 DIAGNOSIS — D075 Carcinoma in situ of prostate: Secondary | ICD-10-CM | POA: Diagnosis not present

## 2021-11-11 DIAGNOSIS — C61 Malignant neoplasm of prostate: Secondary | ICD-10-CM | POA: Diagnosis not present

## 2021-11-29 DIAGNOSIS — N200 Calculus of kidney: Secondary | ICD-10-CM | POA: Diagnosis not present

## 2021-11-29 DIAGNOSIS — R918 Other nonspecific abnormal finding of lung field: Secondary | ICD-10-CM | POA: Diagnosis not present

## 2021-11-29 DIAGNOSIS — I700 Atherosclerosis of aorta: Secondary | ICD-10-CM | POA: Diagnosis not present

## 2021-11-29 DIAGNOSIS — C61 Malignant neoplasm of prostate: Secondary | ICD-10-CM | POA: Diagnosis not present

## 2021-11-29 DIAGNOSIS — I7143 Infrarenal abdominal aortic aneurysm, without rupture: Secondary | ICD-10-CM | POA: Diagnosis not present

## 2021-11-29 DIAGNOSIS — K8020 Calculus of gallbladder without cholecystitis without obstruction: Secondary | ICD-10-CM | POA: Diagnosis not present

## 2021-11-30 DIAGNOSIS — C61 Malignant neoplasm of prostate: Secondary | ICD-10-CM | POA: Diagnosis not present

## 2021-12-04 DIAGNOSIS — Z23 Encounter for immunization: Secondary | ICD-10-CM | POA: Diagnosis not present

## 2021-12-22 DIAGNOSIS — C61 Malignant neoplasm of prostate: Secondary | ICD-10-CM | POA: Diagnosis not present

## 2021-12-29 DIAGNOSIS — C61 Malignant neoplasm of prostate: Secondary | ICD-10-CM | POA: Diagnosis not present

## 2021-12-29 DIAGNOSIS — Z510 Encounter for antineoplastic radiation therapy: Secondary | ICD-10-CM | POA: Diagnosis not present

## 2022-01-04 DIAGNOSIS — C61 Malignant neoplasm of prostate: Secondary | ICD-10-CM | POA: Diagnosis not present

## 2022-01-05 DIAGNOSIS — Z510 Encounter for antineoplastic radiation therapy: Secondary | ICD-10-CM | POA: Diagnosis not present

## 2022-01-05 DIAGNOSIS — C61 Malignant neoplasm of prostate: Secondary | ICD-10-CM | POA: Diagnosis not present

## 2022-01-06 DIAGNOSIS — Z510 Encounter for antineoplastic radiation therapy: Secondary | ICD-10-CM | POA: Diagnosis not present

## 2022-01-06 DIAGNOSIS — C61 Malignant neoplasm of prostate: Secondary | ICD-10-CM | POA: Diagnosis not present

## 2022-01-07 DIAGNOSIS — C61 Malignant neoplasm of prostate: Secondary | ICD-10-CM | POA: Diagnosis not present

## 2022-01-10 DIAGNOSIS — Z510 Encounter for antineoplastic radiation therapy: Secondary | ICD-10-CM | POA: Diagnosis not present

## 2022-01-10 DIAGNOSIS — C61 Malignant neoplasm of prostate: Secondary | ICD-10-CM | POA: Diagnosis not present

## 2022-01-11 DIAGNOSIS — C61 Malignant neoplasm of prostate: Secondary | ICD-10-CM | POA: Diagnosis not present

## 2022-01-11 DIAGNOSIS — Z510 Encounter for antineoplastic radiation therapy: Secondary | ICD-10-CM | POA: Diagnosis not present

## 2022-01-12 DIAGNOSIS — C61 Malignant neoplasm of prostate: Secondary | ICD-10-CM | POA: Diagnosis not present

## 2022-01-12 DIAGNOSIS — Z510 Encounter for antineoplastic radiation therapy: Secondary | ICD-10-CM | POA: Diagnosis not present

## 2022-01-13 DIAGNOSIS — C61 Malignant neoplasm of prostate: Secondary | ICD-10-CM | POA: Diagnosis not present

## 2022-01-13 DIAGNOSIS — Z510 Encounter for antineoplastic radiation therapy: Secondary | ICD-10-CM | POA: Diagnosis not present

## 2022-01-14 DIAGNOSIS — Z510 Encounter for antineoplastic radiation therapy: Secondary | ICD-10-CM | POA: Diagnosis not present

## 2022-01-14 DIAGNOSIS — C61 Malignant neoplasm of prostate: Secondary | ICD-10-CM | POA: Diagnosis not present

## 2022-01-17 DIAGNOSIS — C61 Malignant neoplasm of prostate: Secondary | ICD-10-CM | POA: Diagnosis not present

## 2022-01-17 DIAGNOSIS — Z510 Encounter for antineoplastic radiation therapy: Secondary | ICD-10-CM | POA: Diagnosis not present

## 2022-01-18 DIAGNOSIS — C61 Malignant neoplasm of prostate: Secondary | ICD-10-CM | POA: Diagnosis not present

## 2022-01-18 DIAGNOSIS — Z510 Encounter for antineoplastic radiation therapy: Secondary | ICD-10-CM | POA: Diagnosis not present

## 2022-01-19 DIAGNOSIS — C61 Malignant neoplasm of prostate: Secondary | ICD-10-CM | POA: Diagnosis not present

## 2022-01-19 DIAGNOSIS — Z510 Encounter for antineoplastic radiation therapy: Secondary | ICD-10-CM | POA: Diagnosis not present

## 2022-01-20 DIAGNOSIS — Z510 Encounter for antineoplastic radiation therapy: Secondary | ICD-10-CM | POA: Diagnosis not present

## 2022-01-20 DIAGNOSIS — C61 Malignant neoplasm of prostate: Secondary | ICD-10-CM | POA: Diagnosis not present

## 2022-01-21 DIAGNOSIS — Z510 Encounter for antineoplastic radiation therapy: Secondary | ICD-10-CM | POA: Diagnosis not present

## 2022-01-21 DIAGNOSIS — C61 Malignant neoplasm of prostate: Secondary | ICD-10-CM | POA: Diagnosis not present

## 2022-01-22 DIAGNOSIS — K449 Diaphragmatic hernia without obstruction or gangrene: Secondary | ICD-10-CM | POA: Diagnosis not present

## 2022-01-22 DIAGNOSIS — N401 Enlarged prostate with lower urinary tract symptoms: Secondary | ICD-10-CM | POA: Diagnosis not present

## 2022-01-22 DIAGNOSIS — E785 Hyperlipidemia, unspecified: Secondary | ICD-10-CM | POA: Diagnosis not present

## 2022-01-22 DIAGNOSIS — R000 Tachycardia, unspecified: Secondary | ICD-10-CM | POA: Diagnosis not present

## 2022-01-22 DIAGNOSIS — C61 Malignant neoplasm of prostate: Secondary | ICD-10-CM | POA: Diagnosis not present

## 2022-01-22 DIAGNOSIS — I10 Essential (primary) hypertension: Secondary | ICD-10-CM | POA: Diagnosis not present

## 2022-01-22 DIAGNOSIS — K219 Gastro-esophageal reflux disease without esophagitis: Secondary | ICD-10-CM | POA: Diagnosis not present

## 2022-01-24 DIAGNOSIS — Z510 Encounter for antineoplastic radiation therapy: Secondary | ICD-10-CM | POA: Diagnosis not present

## 2022-01-24 DIAGNOSIS — C61 Malignant neoplasm of prostate: Secondary | ICD-10-CM | POA: Diagnosis not present

## 2022-01-25 DIAGNOSIS — Z510 Encounter for antineoplastic radiation therapy: Secondary | ICD-10-CM | POA: Diagnosis not present

## 2022-01-25 DIAGNOSIS — C61 Malignant neoplasm of prostate: Secondary | ICD-10-CM | POA: Diagnosis not present

## 2022-01-26 DIAGNOSIS — Z510 Encounter for antineoplastic radiation therapy: Secondary | ICD-10-CM | POA: Diagnosis not present

## 2022-01-26 DIAGNOSIS — C61 Malignant neoplasm of prostate: Secondary | ICD-10-CM | POA: Diagnosis not present

## 2022-01-27 DIAGNOSIS — Z510 Encounter for antineoplastic radiation therapy: Secondary | ICD-10-CM | POA: Diagnosis not present

## 2022-01-27 DIAGNOSIS — C61 Malignant neoplasm of prostate: Secondary | ICD-10-CM | POA: Diagnosis not present

## 2022-01-28 DIAGNOSIS — C61 Malignant neoplasm of prostate: Secondary | ICD-10-CM | POA: Diagnosis not present

## 2022-01-28 DIAGNOSIS — Z510 Encounter for antineoplastic radiation therapy: Secondary | ICD-10-CM | POA: Diagnosis not present

## 2022-01-31 DIAGNOSIS — C61 Malignant neoplasm of prostate: Secondary | ICD-10-CM | POA: Diagnosis not present

## 2022-01-31 DIAGNOSIS — Z510 Encounter for antineoplastic radiation therapy: Secondary | ICD-10-CM | POA: Diagnosis not present

## 2022-02-01 DIAGNOSIS — C61 Malignant neoplasm of prostate: Secondary | ICD-10-CM | POA: Diagnosis not present

## 2022-02-01 DIAGNOSIS — Z510 Encounter for antineoplastic radiation therapy: Secondary | ICD-10-CM | POA: Diagnosis not present

## 2022-02-02 DIAGNOSIS — Z510 Encounter for antineoplastic radiation therapy: Secondary | ICD-10-CM | POA: Diagnosis not present

## 2022-02-02 DIAGNOSIS — C61 Malignant neoplasm of prostate: Secondary | ICD-10-CM | POA: Diagnosis not present

## 2022-02-03 DIAGNOSIS — C61 Malignant neoplasm of prostate: Secondary | ICD-10-CM | POA: Diagnosis not present

## 2022-02-03 DIAGNOSIS — Z510 Encounter for antineoplastic radiation therapy: Secondary | ICD-10-CM | POA: Diagnosis not present

## 2022-02-04 DIAGNOSIS — C61 Malignant neoplasm of prostate: Secondary | ICD-10-CM | POA: Diagnosis not present

## 2022-02-04 DIAGNOSIS — Z510 Encounter for antineoplastic radiation therapy: Secondary | ICD-10-CM | POA: Diagnosis not present

## 2022-02-07 DIAGNOSIS — C61 Malignant neoplasm of prostate: Secondary | ICD-10-CM | POA: Diagnosis not present

## 2022-02-07 DIAGNOSIS — Z510 Encounter for antineoplastic radiation therapy: Secondary | ICD-10-CM | POA: Diagnosis not present

## 2022-02-08 DIAGNOSIS — C61 Malignant neoplasm of prostate: Secondary | ICD-10-CM | POA: Diagnosis not present

## 2022-02-08 DIAGNOSIS — Z510 Encounter for antineoplastic radiation therapy: Secondary | ICD-10-CM | POA: Diagnosis not present

## 2022-02-09 DIAGNOSIS — N3289 Other specified disorders of bladder: Secondary | ICD-10-CM | POA: Diagnosis not present

## 2022-02-09 DIAGNOSIS — C61 Malignant neoplasm of prostate: Secondary | ICD-10-CM | POA: Diagnosis not present

## 2022-02-09 DIAGNOSIS — Z510 Encounter for antineoplastic radiation therapy: Secondary | ICD-10-CM | POA: Diagnosis not present

## 2022-02-10 DIAGNOSIS — C61 Malignant neoplasm of prostate: Secondary | ICD-10-CM | POA: Diagnosis not present

## 2022-02-10 DIAGNOSIS — Z510 Encounter for antineoplastic radiation therapy: Secondary | ICD-10-CM | POA: Diagnosis not present

## 2022-02-11 DIAGNOSIS — Z510 Encounter for antineoplastic radiation therapy: Secondary | ICD-10-CM | POA: Diagnosis not present

## 2022-02-11 DIAGNOSIS — C61 Malignant neoplasm of prostate: Secondary | ICD-10-CM | POA: Diagnosis not present

## 2022-02-14 DIAGNOSIS — Z510 Encounter for antineoplastic radiation therapy: Secondary | ICD-10-CM | POA: Diagnosis not present

## 2022-02-14 DIAGNOSIS — C61 Malignant neoplasm of prostate: Secondary | ICD-10-CM | POA: Diagnosis not present

## 2022-02-15 DIAGNOSIS — C61 Malignant neoplasm of prostate: Secondary | ICD-10-CM | POA: Diagnosis not present

## 2022-02-15 DIAGNOSIS — Z510 Encounter for antineoplastic radiation therapy: Secondary | ICD-10-CM | POA: Diagnosis not present

## 2022-02-16 DIAGNOSIS — Z510 Encounter for antineoplastic radiation therapy: Secondary | ICD-10-CM | POA: Diagnosis not present

## 2022-02-16 DIAGNOSIS — C61 Malignant neoplasm of prostate: Secondary | ICD-10-CM | POA: Diagnosis not present

## 2022-02-17 DIAGNOSIS — Z510 Encounter for antineoplastic radiation therapy: Secondary | ICD-10-CM | POA: Diagnosis not present

## 2022-02-17 DIAGNOSIS — C61 Malignant neoplasm of prostate: Secondary | ICD-10-CM | POA: Diagnosis not present

## 2022-02-18 DIAGNOSIS — Z510 Encounter for antineoplastic radiation therapy: Secondary | ICD-10-CM | POA: Diagnosis not present

## 2022-02-18 DIAGNOSIS — C61 Malignant neoplasm of prostate: Secondary | ICD-10-CM | POA: Diagnosis not present

## 2022-02-21 DIAGNOSIS — Z510 Encounter for antineoplastic radiation therapy: Secondary | ICD-10-CM | POA: Diagnosis not present

## 2022-02-21 DIAGNOSIS — C61 Malignant neoplasm of prostate: Secondary | ICD-10-CM | POA: Diagnosis not present

## 2022-02-22 DIAGNOSIS — Z510 Encounter for antineoplastic radiation therapy: Secondary | ICD-10-CM | POA: Diagnosis not present

## 2022-02-22 DIAGNOSIS — C61 Malignant neoplasm of prostate: Secondary | ICD-10-CM | POA: Diagnosis not present

## 2022-02-23 DIAGNOSIS — Z510 Encounter for antineoplastic radiation therapy: Secondary | ICD-10-CM | POA: Diagnosis not present

## 2022-02-23 DIAGNOSIS — C61 Malignant neoplasm of prostate: Secondary | ICD-10-CM | POA: Diagnosis not present

## 2022-02-24 DIAGNOSIS — C61 Malignant neoplasm of prostate: Secondary | ICD-10-CM | POA: Diagnosis not present

## 2022-02-24 DIAGNOSIS — Z510 Encounter for antineoplastic radiation therapy: Secondary | ICD-10-CM | POA: Diagnosis not present

## 2022-02-25 DIAGNOSIS — C61 Malignant neoplasm of prostate: Secondary | ICD-10-CM | POA: Diagnosis not present

## 2022-02-25 DIAGNOSIS — Z510 Encounter for antineoplastic radiation therapy: Secondary | ICD-10-CM | POA: Diagnosis not present

## 2022-02-28 DIAGNOSIS — Z510 Encounter for antineoplastic radiation therapy: Secondary | ICD-10-CM | POA: Diagnosis not present

## 2022-02-28 DIAGNOSIS — C61 Malignant neoplasm of prostate: Secondary | ICD-10-CM | POA: Diagnosis not present

## 2022-03-01 DIAGNOSIS — C61 Malignant neoplasm of prostate: Secondary | ICD-10-CM | POA: Diagnosis not present

## 2022-03-01 DIAGNOSIS — Z510 Encounter for antineoplastic radiation therapy: Secondary | ICD-10-CM | POA: Diagnosis not present

## 2022-03-02 DIAGNOSIS — C61 Malignant neoplasm of prostate: Secondary | ICD-10-CM | POA: Diagnosis not present

## 2022-03-02 DIAGNOSIS — Z510 Encounter for antineoplastic radiation therapy: Secondary | ICD-10-CM | POA: Diagnosis not present

## 2022-03-03 DIAGNOSIS — Z510 Encounter for antineoplastic radiation therapy: Secondary | ICD-10-CM | POA: Diagnosis not present

## 2022-03-03 DIAGNOSIS — C61 Malignant neoplasm of prostate: Secondary | ICD-10-CM | POA: Diagnosis not present

## 2022-03-04 DIAGNOSIS — Z510 Encounter for antineoplastic radiation therapy: Secondary | ICD-10-CM | POA: Diagnosis not present

## 2022-03-04 DIAGNOSIS — C61 Malignant neoplasm of prostate: Secondary | ICD-10-CM | POA: Diagnosis not present

## 2022-03-11 DIAGNOSIS — N3289 Other specified disorders of bladder: Secondary | ICD-10-CM | POA: Diagnosis not present

## 2022-03-11 DIAGNOSIS — C61 Malignant neoplasm of prostate: Secondary | ICD-10-CM | POA: Diagnosis not present

## 2022-04-12 DIAGNOSIS — C61 Malignant neoplasm of prostate: Secondary | ICD-10-CM | POA: Diagnosis not present

## 2022-04-12 DIAGNOSIS — N3289 Other specified disorders of bladder: Secondary | ICD-10-CM | POA: Diagnosis not present

## 2022-04-15 DIAGNOSIS — C61 Malignant neoplasm of prostate: Secondary | ICD-10-CM | POA: Diagnosis not present

## 2022-04-30 DIAGNOSIS — E785 Hyperlipidemia, unspecified: Secondary | ICD-10-CM | POA: Diagnosis not present

## 2022-04-30 DIAGNOSIS — K449 Diaphragmatic hernia without obstruction or gangrene: Secondary | ICD-10-CM | POA: Diagnosis not present

## 2022-04-30 DIAGNOSIS — N401 Enlarged prostate with lower urinary tract symptoms: Secondary | ICD-10-CM | POA: Diagnosis not present

## 2022-04-30 DIAGNOSIS — K219 Gastro-esophageal reflux disease without esophagitis: Secondary | ICD-10-CM | POA: Diagnosis not present

## 2022-04-30 DIAGNOSIS — C61 Malignant neoplasm of prostate: Secondary | ICD-10-CM | POA: Diagnosis not present

## 2022-04-30 DIAGNOSIS — I10 Essential (primary) hypertension: Secondary | ICD-10-CM | POA: Diagnosis not present

## 2022-04-30 DIAGNOSIS — R000 Tachycardia, unspecified: Secondary | ICD-10-CM | POA: Diagnosis not present

## 2022-04-30 DIAGNOSIS — J61 Pneumoconiosis due to asbestos and other mineral fibers: Secondary | ICD-10-CM | POA: Diagnosis not present

## 2022-07-27 DIAGNOSIS — N3289 Other specified disorders of bladder: Secondary | ICD-10-CM | POA: Diagnosis not present

## 2022-07-27 DIAGNOSIS — C61 Malignant neoplasm of prostate: Secondary | ICD-10-CM | POA: Diagnosis not present

## 2022-08-31 DIAGNOSIS — L7622 Postprocedural hemorrhage and hematoma of skin and subcutaneous tissue following other procedure: Secondary | ICD-10-CM | POA: Diagnosis not present

## 2022-11-29 DIAGNOSIS — N3289 Other specified disorders of bladder: Secondary | ICD-10-CM | POA: Diagnosis not present

## 2022-11-29 DIAGNOSIS — C61 Malignant neoplasm of prostate: Secondary | ICD-10-CM | POA: Diagnosis not present

## 2022-11-29 DIAGNOSIS — J61 Pneumoconiosis due to asbestos and other mineral fibers: Secondary | ICD-10-CM | POA: Diagnosis not present

## 2022-11-29 DIAGNOSIS — R351 Nocturia: Secondary | ICD-10-CM | POA: Diagnosis not present

## 2022-12-04 DIAGNOSIS — L03113 Cellulitis of right upper limb: Secondary | ICD-10-CM | POA: Diagnosis not present

## 2022-12-07 DIAGNOSIS — C61 Malignant neoplasm of prostate: Secondary | ICD-10-CM | POA: Diagnosis not present

## 2023-03-03 DIAGNOSIS — R590 Localized enlarged lymph nodes: Secondary | ICD-10-CM | POA: Diagnosis not present

## 2023-03-03 DIAGNOSIS — J029 Acute pharyngitis, unspecified: Secondary | ICD-10-CM | POA: Diagnosis not present

## 2023-03-29 DIAGNOSIS — C61 Malignant neoplasm of prostate: Secondary | ICD-10-CM | POA: Diagnosis not present

## 2023-03-29 DIAGNOSIS — R351 Nocturia: Secondary | ICD-10-CM | POA: Diagnosis not present

## 2023-03-29 DIAGNOSIS — N3289 Other specified disorders of bladder: Secondary | ICD-10-CM | POA: Diagnosis not present

## 2023-04-22 IMAGING — MR MR PROSTATE WO/W CM
12 series · 48 of 48 positions shown · IV contrast (18ml multihance)
Comparison: None

CLINICAL DATA: Increasing PSA, up to PSA of 8.5 in Sunday November, 2018, now reported at 3.0. Previous biopsy on 02/26/2018 prior
biopsy with suspicious findings without frank prostate cancer.

EXAM:
MR PROSTATE WITHOUT AND WITH CONTRAST
TECHNIQUE: Multiplanar multisequence MRI images were obtained of the pelvis
centered about the prostate. Pre and post contrast images were
obtained.
CONTRAST:  17mL MULTIHANCE GADOBENATE DIMEGLUMINE 529 MG/ML IV SOLN

[Series 3: T2 · coronal · 3.0mm · 0.56mm/px · 1 of 23 slices shown (1 of 3)]
[im 1/23]
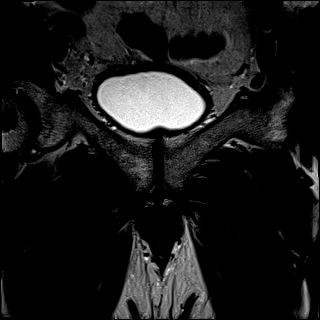

[Series 4: T1 · axial · 5.0mm · 1.25mm/px · z∈[-113,+122]mm · 2 of 96 slices shown]
[im 1/96]
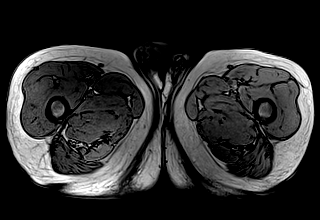
[im 96/96]
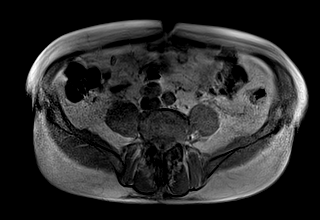

[Series 5: DWI · axial · 3.0mm · 1.75mm/px · z∈[-61,+5]mm · 2 of 69 slices shown (1 of 3)]
[im 1/69]
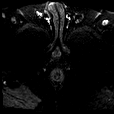
[im 69/69]
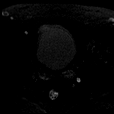

[Series 6: DWI · axial · 3.0mm · 1.75mm/px · 1 of 23 slices shown (2 of 3)]
[im 1/23]
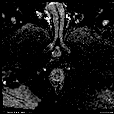

[Series 7: DWI · axial · 3.0mm · 1.75mm/px · 1 of 23 slices shown (3 of 3)]
[im 1/23]
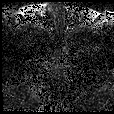

[Series 8: T2 · axial · 3.0mm · 0.56mm/px · 1 of 23 slices shown (2 of 3)]
[im 1/23]
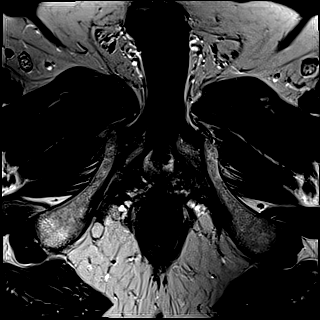

[Series 9: T2 · axial · 1.0mm · 1.04mm/px · z∈[-68,+11]mm · 2 of 80 slices shown (3 of 3)]
[im 1/80]
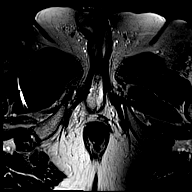
[im 80/80]
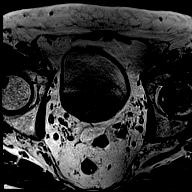

[Series 10: pre t1_twist_tra_dyn · axial · non-contrast · 3.5mm · 0.83mm/px · 1 of 20 slices shown]
[im 1/20]
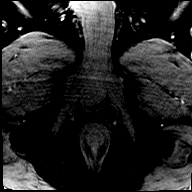

[Series 11: post t1_twist_tra_dyn-copy center · axial · non-contrast · 3.5mm · 0.83mm/px · z∈[-62,+5]mm · 17 of 600 slices shown]
[im 1/600]
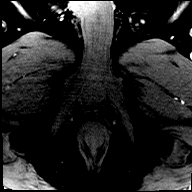
[im 38/600]
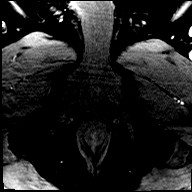
[im 75/600]
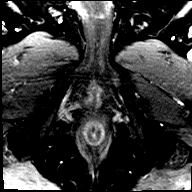
[im 113/600]
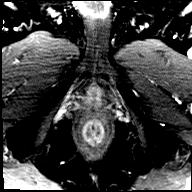
[im 150/600]
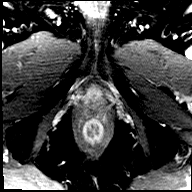
[im 188/600]
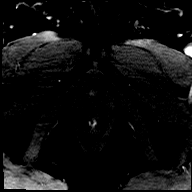
[im 225/600]
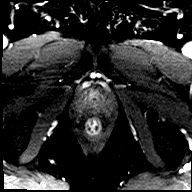
[im 263/600]
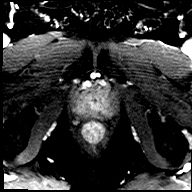
[im 300/600]
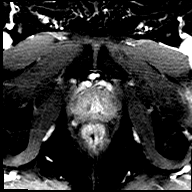
[im 337/600]
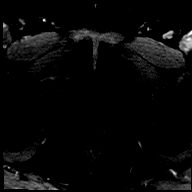
[im 375/600]
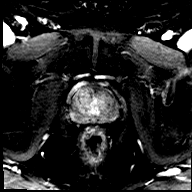
[im 412/600]
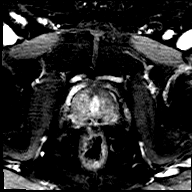
[im 450/600]
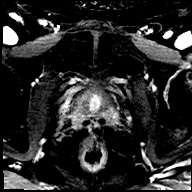
[im 487/600]
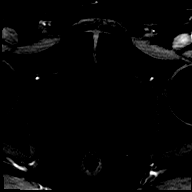
[im 525/600]
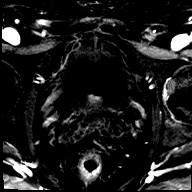
[im 562/600]
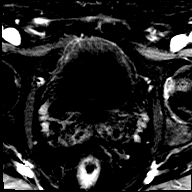
[im 600/600]
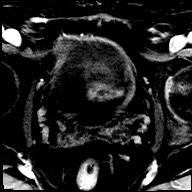

[Series 12: post t1_twist_tra_dyn-copy cent_sub · axial · 3.5mm · 0.83mm/px · z∈[-62,+5]mm · 16 of 572 slices shown]
[im 1/572]
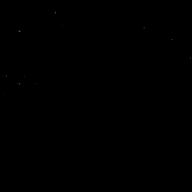
[im 39/572]
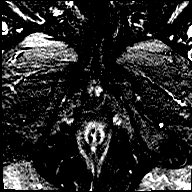
[im 77/572]
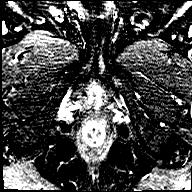
[im 115/572]
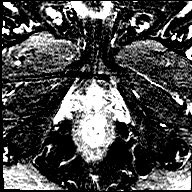
[im 153/572]
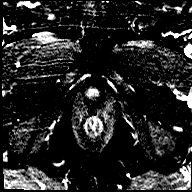
[im 191/572]
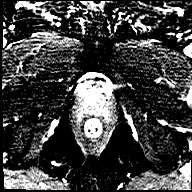
[im 229/572]
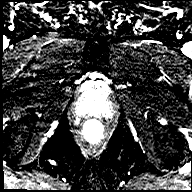
[im 267/572]
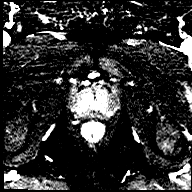
[im 305/572]
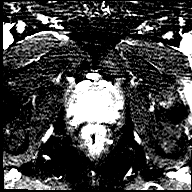
[im 343/572]
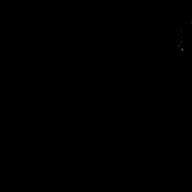
[im 381/572]
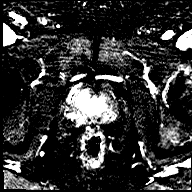
[im 419/572]
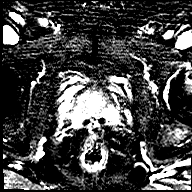
[im 457/572]
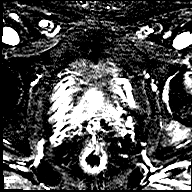
[im 495/572]
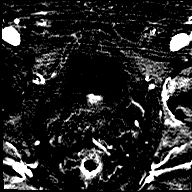
[im 533/572]
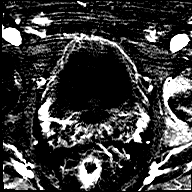
[im 572/572]
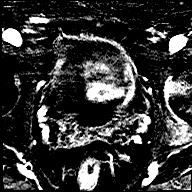

[Series 13: t1_vibe_dixon_tra_f · axial · 2.5mm · 0.91mm/px · z∈[-94,+104]mm · 2 of 80 slices shown]
[im 1/80]
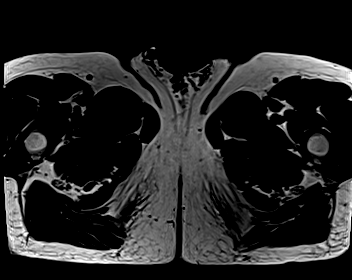
[im 80/80]
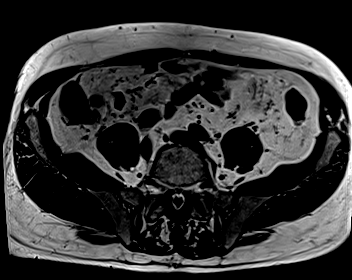

[Series 14: t1_vibe_dixon_tra_w · axial · 2.5mm · 0.91mm/px · z∈[-94,+104]mm · 2 of 80 slices shown]
[im 1/80]
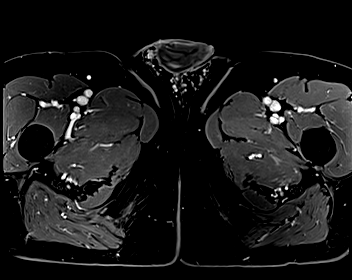
[im 80/80]
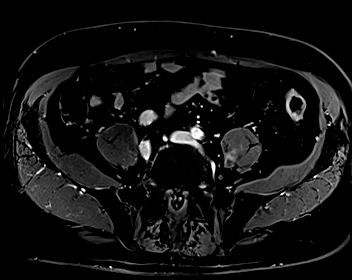

[48 of 48 positions shown; findings below may reference images not displayed]

FINDINGS: Prostate:

Transitional zone: Signs of BPH throughout a nodular transitional
zone. No focal high-risk lesion.

Peripheral zone: RIGHT prostate base posterolateral RIGHT prostate
with focal area of restricted diffusion corresponding to more focal
area of T2 signal abnormality 6 mm (image [DATE])

Background of linear areas and more diffuse T2 signal throughout the
peripheral zone favored to represent sequela of prior prostatitis.
This area shows more pronounced diffusion related abnormality but
not with signs of restricted diffusion on high B value sequences.
PIRADS category 3 at baseline. This is elevated to PIRADS category 4
based on enhancement pattern with focal area of nodular very early
arterial enhancement noted in the area (image 149/series 11).

Volume: 21.2 cc

Transcapsular spread:  Absent

Seminal vesicle involvement: Absent

Neurovascular bundle involvement: Absent

Pelvic adenopathy: Absent

Bone metastasis: Absent

Other findings: Avascular necrosis of the bilateral femoral heads
LEFT greater than RIGHT.
IMPRESSION: 1. PIRADS category 4 lesion in the RIGHT posterolateral prostate
base.
2. Avascular necrosis of the bilateral femoral heads LEFT greater
than RIGHT.

## 2023-05-02 DIAGNOSIS — R509 Fever, unspecified: Secondary | ICD-10-CM | POA: Diagnosis not present

## 2023-05-02 DIAGNOSIS — J449 Chronic obstructive pulmonary disease, unspecified: Secondary | ICD-10-CM | POA: Diagnosis not present

## 2023-05-02 DIAGNOSIS — J61 Pneumoconiosis due to asbestos and other mineral fibers: Secondary | ICD-10-CM | POA: Diagnosis not present

## 2023-05-02 DIAGNOSIS — R0981 Nasal congestion: Secondary | ICD-10-CM | POA: Diagnosis not present

## 2023-05-02 DIAGNOSIS — R051 Acute cough: Secondary | ICD-10-CM | POA: Diagnosis not present

## 2023-05-02 DIAGNOSIS — R062 Wheezing: Secondary | ICD-10-CM | POA: Diagnosis not present

## 2023-06-26 NOTE — H&P (Signed)
TOTAL HIP ADMISSION H&P  Patient is admitted for left total hip arthroplasty.  Subjective:  Chief Complaint: Left hip pain  HPI: Todd Koch, 77 y.o. male, has a history of pain and functional disability in the left hip due to arthritis and patient has failed non-surgical conservative treatments for greater than 12 weeks to include NSAID's and/or analgesics, use of assistive devices, and activity modification. Onset of symptoms was gradual, starting 5 years ago with gradually worsening course since that time. The patient noted no past surgery on the left hip. Patient currently rates pain in the left hip at 7 out of 10 with activity. Patient has worsening of pain with activity and weight bearing, trendelenberg gait, pain that interfers with activities of daily living, and pain with passive range of motion. Patient has evidence of subchondral cysts, periarticular osteophytes, and joint space narrowing by imaging studies. This condition presents safety issues increasing the risk of falls. There is no current active infection.   Objective:  Physical Exam: - Well-developed male alert and oriented in no apparent distress    - Antalgic gait pattern on his left.    - Left hip can be flexed to 100 with minimum internal rotation only about 20 external rotation 20 abduction.    - There is no trochanteric tenderness   IMAGING:  Radiographs from May 2024 demonstrate severe osteonecrosis of the left hip, with close to 100% of his femoral head involved with subchondral sclerosis. There is still some space left in the hip joint, indicating that the femoral head has not yet collapsed. There are also avascular changes on the right hip, but these are not as severe as those on the left.  Assessment/Plan:  End stage arthritis, left hip  The patient history, physical examination, clinical judgement of the provider and imaging studies are consistent with end stage degenerative joint disease of the left hip  and total hip arthroplasty is deemed medically necessary. The treatment options including medical management, injection therapy, arthroscopy and arthroplasty were discussed at length. The risks and benefits of total hip arthroplasty were presented and reviewed. The risks due to aseptic loosening, infection, stiffness, dislocation/subluxation, thromboembolic complications and other imponderables were discussed. The patient acknowledged the explanation, agreed to proceed with the plan and consent was signed. Patient is being admitted for inpatient treatment for surgery, pain control, PT, OT, prophylactic antibiotics, VTE prophylaxis, progressive ambulation and ADLs and discharge planning.The patient is planning to be discharged  home with HEP .   Patient's anticipated LOS is less than 2 midnights, meeting these requirements: - Younger than 28 - Lives within 1 hour of care - Has a competent adult at home to recover with post-op recover - NO history of  - Chronic pain requiring opiods  - Diabetes  - Coronary Artery Disease  - Heart failure  - Heart attack  - Stroke  - DVT/VTE  - Cardiac arrhythmia  - Respiratory Failure  - Renal failure  - Anemia  - Advanced Liver disease  Therapy Plans: HEP Disposition: Home with son and daughters Planned DVT Prophylaxis: Xarelto (hx prostate cancer) DME Needed: Has a walker, needs 3 in 1 PCP: Dr. Raphael Gibney (clearance pending) Cardiology: Lorelle Gibbs, PA-C (clearance received) TXA: IV Allergies: NKDA Anesthesia Concerns: Hard to wake BMI: 27.6 Pharmacy: [facility] ([place], [place])  Other: -Pt will bring clearance form to his PCP  - Patient was instructed on what medications to stop prior to surgery. - Follow-up visit in 2 weeks with Dr. Lequita Halt - Begin physical  therapy following surgery - Pre-operative lab work as pre-surgical testing - Prescriptions will be provided in hospital at time of discharge  Weston Brass, PA-C Orthopedic  Surgery [facility] [place]

## 2023-06-27 ENCOUNTER — Encounter (HOSPITAL_COMMUNITY): Payer: Self-pay

## 2023-06-27 NOTE — Progress Notes (Addendum)
COVID Vaccine Completed:  Yes  Date of COVID positive in last 90 days:  No  [facility] [place] [place] Pulmonologist - [place]  Clearance on chart dated 06-26-23  Chest x-ray - 09-26-22 on chart EKG - 01-31-23 at [facility], copy on chart Stress Test - 12-28-16 at [facility] ECHO - 02-24-22 at [facility] Cardiac Cath - N/A Pacemaker/ICD device last checked: Spinal Cord Stimulator:  N/A Ziopatch - 12-27-16 at [facility]  Bowel Prep - N/A  Sleep Study - Yes, +sleep apnea CPAP - Yes  Fasting Blood Sugar - N/A Checks Blood Sugar _____ times a day  Last dose of GLP1 agonist-  N/A GLP1 instructions:  N/A   Last dose of SGLT-2 inhibitors-  N/A SGLT-2 instructions: N/A  Blood Thinner Instructions:N/A Aspirin Instructions: Last Dose:  Activity level:  Can go up a flight of stairs and perform activities of daily living without stopping and without symptoms of chest pain.  Patient has shortness of breath, states that he feels that he is at his baseline, has not worsened.  Anesthesia review: Tachycardia , shortness of breath,  COPD, asbestos lung, emphysema.  Patient states that he thinks he has an aneurysm.  All physicians at [facility]  Patient states that he bleeds easy  Patient denies shortness of breath, fever, cough and chest pain at PAT appointment  Patient verbalized understanding of instructions that were given to them at the PAT appointment. Patient was also instructed that they will need to review over the PAT instructions again at home before surgery.

## 2023-06-27 NOTE — Patient Instructions (Addendum)
SURGICAL WAITING ROOM VISITATION Patients having surgery or a procedure may have no more than 2 support people in the waiting area - these visitors may rotate.    Children under the age of 91 must have an adult with them who is not the patient.  If the patient needs to stay at the hospital during part of their recovery, the visitor guidelines for inpatient rooms apply. Pre-op nurse will coordinate an appropriate time for 1 support person to accompany patient in pre-op.  This support person may not rotate.    Please refer to the [facility] website for the visitor guidelines for Inpatients (after your surgery is over and you are in a regular room).       Your procedure is scheduled on: 07-17-23   Report to [facility] Main Entrance    Report to admitting at 12:15 PM   Call this number if you have problems the morning of surgery [phone]   Do not eat food :After Midnight.   After Midnight you may have the following liquids until 11:40 AM DAY OF SURGERY  Water Non-Citrus Juices (without pulp, NO RED-Apple, White grape, White cranberry) Black Coffee (NO MILK/CREAM OR CREAMERS, sugar ok)  Clear Tea (NO MILK/CREAM OR CREAMERS, sugar ok) regular and decaf                             Plain Jell-O (NO RED)                                           Fruit ices (not with fruit pulp, NO RED)                                     Popsicles (NO RED)                                                               Sports drinks like Gatorade (NO RED)                   The day of surgery:  Drink ONE (1) Pre-Surgery Clear Ensure at 11:40 AM the morning of surgery. Drink in one sitting. Do not sip.  This drink was given to you during your hospital  pre-op appointment visit. Nothing else to drink after completing the Pre-Surgery Clear Ensure           If you have questions, please contact your surgeon's office.   FOLLOW  ANY ADDITIONAL PRE OP INSTRUCTIONS YOU RECEIVED FROM YOUR  SURGEON'S OFFICE!!!     Oral Hygiene is also important to reduce your risk of infection.                                    Remember - BRUSH YOUR TEETH THE MORNING OF SURGERY WITH YOUR REGULAR TOOTHPASTE   Do NOT smoke after Midnight   Take these medicines the morning of surgery with A SIP OF WATER:   Diltiazem  Omeprazole  Tamsulosin  Okay to use inhalers   Bring CPAP mask and tubing day of surgery.                              You may not have any metal on your body including jewelry, and body piercing             Do not wear  lotions, powders, cologne, or deodorant              Men may shave face and neck.   Do not bring valuables to the hospital. [facility] IS NOT RESPONSIBLE   FOR VALUABLES.   Contacts, dentures or bridgework may not be worn into surgery.   Bring small overnight bag day of surgery.   DO NOT BRING YOUR HOME MEDICATIONS TO THE HOSPITAL. PHARMACY WILL DISPENSE MEDICATIONS LISTED ON YOUR MEDICATION LIST TO YOU DURING YOUR ADMISSION IN THE HOSPITAL!     Special Instructions: Bring a copy of your healthcare power of attorney and living will documents the day of surgery if you haven't scanned them before.              Please read over the following fact sheets you were given: IF YOU HAVE QUESTIONS ABOUT YOUR PRE-OP INSTRUCTIONS PLEASE CALL [phone] [place]  If you received a COVID test during your pre-op visit  it is requested that you wear a mask when out in public, stay away from anyone that may not be feeling well and notify your surgeon if you develop symptoms. If you test positive for Covid or have been in contact with anyone that has tested positive in the last 10 days please notify you surgeon.  [facility] - Preparing for Surgery   Pre-operative 5 CHG Bath Instructions   You can play a key role in reducing the risk of infection after surgery. Your skin needs to be as free of germs as possible. You can reduce the number of germs on your skin by  washing with CHG (chlorhexidine gluconate) soap before surgery. CHG is an antiseptic soap that kills germs and continues to kill germs even after washing.   DO NOT use if you have an allergy to chlorhexidine/CHG or antibacterial soaps. If your skin becomes reddened or irritated, stop using the CHG and notify one of our RNs at  [phone] .   Please shower with the CHG soap starting 4 days before surgery using the following schedule:     Please keep in mind the following:  DO NOT shave, including legs and underarms, starting the day of your first shower.   You may shave your face at any point before/day of surgery.  Place clean sheets on your bed the day you start using CHG soap. Use a clean washcloth (not used since being washed) for each shower. DO NOT sleep with pets once you start using the CHG.   CHG Shower Instructions:  If you choose to wash your hair and private area, wash first with your normal shampoo/soap.  After you use shampoo/soap, rinse your hair and body thoroughly to remove shampoo/soap residue.  Turn the water OFF and apply about 3 tablespoons (45 ml) of CHG soap to a CLEAN washcloth.  Apply CHG soap ONLY FROM YOUR NECK DOWN TO YOUR TOES (washing for 3-5 minutes)  DO NOT use CHG soap on face, private areas, open wounds, or sores.  Pay special attention to the area where your surgery is  being performed.  If you are having back surgery, having someone wash your back for you may be helpful. Wait 2 minutes after CHG soap is applied, then you may rinse off the CHG soap.  Pat dry with a clean towel  Put on clean clothes/pajamas   If you choose to wear lotion, please use ONLY the CHG-compatible lotions on the back of this paper.     Additional instructions for the day of surgery: DO NOT APPLY any lotions, deodorants, cologne, or perfumes.   Put on clean/comfortable clothes.  Brush your teeth.  Ask your nurse before applying any prescription medications to the  skin.      CHG Compatible Lotions   Aveeno Moisturizing lotion  Cetaphil Moisturizing Cream  Cetaphil Moisturizing Lotion  Clairol Herbal Essence Moisturizing Lotion, Dry Skin  Clairol Herbal Essence Moisturizing Lotion, Extra Dry Skin  Clairol Herbal Essence Moisturizing Lotion, Normal Skin  Curel Age Defying Therapeutic Moisturizing Lotion with Alpha Hydroxy  Curel Extreme Care Body Lotion  Curel Soothing Hands Moisturizing Hand Lotion  Curel Therapeutic Moisturizing Cream, Fragrance-Free  Curel Therapeutic Moisturizing Lotion, Fragrance-Free  Curel Therapeutic Moisturizing Lotion, Original Formula  Eucerin Daily Replenishing Lotion  Eucerin Dry Skin Therapy Plus Alpha Hydroxy Crme  Eucerin Dry Skin Therapy Plus Alpha Hydroxy Lotion  Eucerin Original Crme  Eucerin Original Lotion  Eucerin Plus Crme Eucerin Plus Lotion  Eucerin TriLipid Replenishing Lotion  Keri Anti-Bacterial Hand Lotion  Keri Deep Conditioning Original Lotion Dry Skin Formula Softly Scented  Keri Deep Conditioning Original Lotion, Fragrance Free Sensitive Skin Formula  Keri Lotion Fast Absorbing Fragrance Free Sensitive Skin Formula  Keri Lotion Fast Absorbing Softly Scented Dry Skin Formula  Keri Original Lotion  Keri Skin Renewal Lotion Keri Silky Smooth Lotion  Keri Silky Smooth Sensitive Skin Lotion  Nivea Body Creamy Conditioning Oil  Nivea Body Extra Enriched Lotion  Nivea Body Original Lotion  Nivea Body Sheer Moisturizing Lotion Nivea Crme  Nivea Skin Firming Lotion  NutraDerm 30 Skin Lotion  NutraDerm Skin Lotion  NutraDerm Therapeutic Skin Cream  NutraDerm Therapeutic Skin Lotion  ProShield Protective Hand Cream  Provon moisturizing lotion   PATIENT SIGNATURE_________________________________  NURSE SIGNATURE__________________________________  ________________________________________________________________________    Todd Koch  An incentive spirometer is a tool that  can help keep your lungs clear and active. This tool measures how well you are filling your lungs with each breath. Taking long deep breaths may help reverse or decrease the chance of developing breathing (pulmonary) problems (especially infection) following: A long period of time when you are unable to move or be active. BEFORE THE PROCEDURE  If the spirometer includes an indicator to show your best effort, your nurse or respiratory therapist will set it to a desired goal. If possible, sit up straight or lean slightly forward. Try not to slouch. Hold the incentive spirometer in an upright position. INSTRUCTIONS FOR USE  Sit on the edge of your bed if possible, or sit up as far as you can in bed or on a chair. Hold the incentive spirometer in an upright position. Breathe out normally. Place the mouthpiece in your mouth and seal your lips tightly around it. Breathe in slowly and as deeply as possible, raising the piston or the ball toward the top of the column. Hold your breath for 3-5 seconds or for as long as possible. Allow the piston or ball to fall to the bottom of the column. Remove the mouthpiece from your mouth and breathe out normally. Rest for a  few seconds and repeat Steps 1 through 7 at least 10 times every 1-2 hours when you are awake. Take your time and take a few normal breaths between deep breaths. The spirometer may include an indicator to show your best effort. Use the indicator as a goal to work toward during each repetition. After each set of 10 deep breaths, practice coughing to be sure your lungs are clear. If you have an incision (the cut made at the time of surgery), support your incision when coughing by placing a pillow or rolled up towels firmly against it. Once you are able to get out of bed, walk around indoors and cough well. You may stop using the incentive spirometer when instructed by your caregiver.  RISKS AND COMPLICATIONS Take your time so you do not get dizzy or  light-headed. If you are in pain, you may need to take or ask for pain medication before doing incentive spirometry. It is harder to take a deep breath if you are having pain. AFTER USE Rest and breathe slowly and easily. It can be helpful to keep track of a log of your progress. Your caregiver can provide you with a simple table to help with this. If you are using the spirometer at home, follow these instructions: SEEK MEDICAL CARE IF:  You are having difficultly using the spirometer. You have trouble using the spirometer as often as instructed. Your pain medication is not giving enough relief while using the spirometer. You develop fever of 100.5 F (38.1 C) or higher. SEEK IMMEDIATE MEDICAL CARE IF:  You cough up bloody sputum that had not been present before. You develop fever of 102 F (38.9 C) or greater. You develop worsening pain at or near the incision site. MAKE SURE YOU:  Understand these instructions. Will watch your condition. Will get help right away if you are not doing well or get worse. Document Released: 04/24/2007 Document Revised: 03/05/2012 Document Reviewed: 06/25/2007 Exit[employer] Patient Information 2014 [employer], [place].   ________________________________________________________________________ WHAT IS A BLOOD TRANSFUSION? Blood Transfusion Information  A transfusion is the replacement of blood or some of its parts. Blood is made up of multiple cells which provide different functions. Red blood cells carry oxygen and are used for blood loss replacement. White blood cells fight against infection. Platelets control bleeding. Plasma helps clot blood. Other blood products are available for specialized needs, such as hemophilia or other clotting disorders. BEFORE THE TRANSFUSION  Who gives blood for transfusions?  Healthy volunteers who are fully evaluated to make sure their blood is safe. This is blood bank blood. Transfusion therapy is the safest it has ever been  in the practice of medicine. Before blood is taken from a donor, a complete history is taken to make sure that person has no history of diseases nor engages in risky social behavior (examples are intravenous drug use or sexual activity with multiple partners). The donor's travel history is screened to minimize risk of transmitting infections, such as malaria. The donated blood is tested for signs of infectious diseases, such as HIV and hepatitis. The blood is then tested to be sure it is compatible with you in order to minimize the chance of a transfusion reaction. If you or a relative donates blood, this is often done in anticipation of surgery and is not appropriate for emergency situations. It takes many days to process the donated blood. RISKS AND COMPLICATIONS Although transfusion therapy is very safe and saves many lives, the main dangers of transfusion include:  Getting an infectious disease. Developing a transfusion reaction. This is an allergic reaction to something in the blood you were given. Every precaution is taken to prevent this. The decision to have a blood transfusion has been considered carefully by your caregiver before blood is given. Blood is not given unless the benefits outweigh the risks. AFTER THE TRANSFUSION Right after receiving a blood transfusion, you will usually feel much better and more energetic. This is especially true if your red blood cells have gotten low (anemic). The transfusion raises the level of the red blood cells which carry oxygen, and this usually causes an energy increase. The nurse administering the transfusion will monitor you carefully for complications. HOME CARE INSTRUCTIONS  No special instructions are needed after a transfusion. You may find your energy is better. Speak with your caregiver about any limitations on activity for underlying diseases you may have. SEEK MEDICAL CARE IF:  Your condition is not improving after your transfusion. You develop  redness or irritation at the intravenous (IV) site. SEEK IMMEDIATE MEDICAL CARE IF:  Any of the following symptoms occur over the next 12 hours: Shaking chills. You have a temperature by mouth above 102 F (38.9 C), not controlled by medicine. Chest, back, or muscle pain. People around you feel you are not acting correctly or are confused. Shortness of breath or difficulty breathing. Dizziness and fainting. You get a rash or develop hives. You have a decrease in urine output. Your urine turns a dark color or changes to pink, red, or brown. Any of the following symptoms occur over the next 10 days: You have a temperature by mouth above 102 F (38.9 C), not controlled by medicine. Shortness of breath. Weakness after normal activity. The white part of the eye turns yellow (jaundice). You have a decrease in the amount of urine or are urinating less often. Your urine turns a dark color or changes to pink, red, or brown. Document Released: 12/09/2000 Document Revised: 03/05/2012 Document Reviewed: 07/28/2008 [facility] Patient Information 2014 [place], [place].  _______________________________________________________________________

## 2023-07-03 DIAGNOSIS — R351 Nocturia: Secondary | ICD-10-CM | POA: Diagnosis not present

## 2023-07-03 DIAGNOSIS — C61 Malignant neoplasm of prostate: Secondary | ICD-10-CM | POA: Diagnosis not present

## 2023-07-03 DIAGNOSIS — N3289 Other specified disorders of bladder: Secondary | ICD-10-CM | POA: Diagnosis not present

## 2023-07-06 ENCOUNTER — Other Ambulatory Visit: Payer: Self-pay

## 2023-07-06 ENCOUNTER — Encounter (HOSPITAL_COMMUNITY): Payer: Self-pay

## 2023-07-06 ENCOUNTER — Encounter (HOSPITAL_COMMUNITY)
Admission: RE | Admit: 2023-07-06 | Discharge: 2023-07-06 | Disposition: A | Discharge status: Home / Self Care | Payer: No Typology Code available for payment source | Source: Ambulatory Visit | Attending: Orthopedic Surgery | Admitting: Orthopedic Surgery

## 2023-07-06 VITALS — BP 132/79 | HR 93 | Temp 98.2°F | Resp 20 | Ht 71.0 in | Wt 180.0 lb

## 2023-07-06 DIAGNOSIS — Z01818 Encounter for other preprocedural examination: Secondary | ICD-10-CM

## 2023-07-06 DIAGNOSIS — Z01812 Encounter for preprocedural laboratory examination: Secondary | ICD-10-CM | POA: Diagnosis present

## 2023-07-06 DIAGNOSIS — I2510 Atherosclerotic heart disease of native coronary artery without angina pectoris: Secondary | ICD-10-CM

## 2023-07-06 HISTORY — DX: Malignant neoplasm of prostate: C61

## 2023-07-06 HISTORY — DX: Unspecified malignant neoplasm of skin, unspecified: C44.90

## 2023-07-06 HISTORY — DX: Gastro-esophageal reflux disease without esophagitis: K21.9

## 2023-07-06 HISTORY — DX: Hyperlipidemia, unspecified: E78.5

## 2023-07-06 HISTORY — DX: Personal history of irradiation: Z92.3

## 2023-07-06 HISTORY — DX: Emphysema, unspecified: J43.9

## 2023-07-06 HISTORY — DX: Unspecified osteoarthritis, unspecified site: M19.90

## 2023-07-06 HISTORY — DX: Tachycardia, unspecified: R00.0

## 2023-07-06 HISTORY — DX: Unspecified asthma, uncomplicated: J45.909

## 2023-07-06 HISTORY — DX: Dyspnea, unspecified: R06.00

## 2023-07-06 LAB — CBC
HCT: 39.4 % (ref 39.0–52.0)
Hemoglobin: 12.3 g/dL — ABNORMAL LOW (ref 13.0–17.0)
MCH: 30.3 pg (ref 26.0–34.0)
MCHC: 31.2 g/dL (ref 30.0–36.0)
MCV: 97 fL (ref 80.0–100.0)
Platelets: 256 10*3/uL (ref 150–400)
RBC: 4.06 MIL/uL — ABNORMAL LOW (ref 4.22–5.81)
RDW: 14 % (ref 11.5–15.5)
WBC: 5.7 10*3/uL (ref 4.0–10.5)
nRBC: 0 % (ref 0.0–0.2)

## 2023-07-06 LAB — BASIC METABOLIC PANEL
Anion gap: 7 (ref 5–15)
BUN: 16 mg/dL (ref 8–23)
CO2: 25 mmol/L (ref 22–32)
Calcium: 9.1 mg/dL (ref 8.9–10.3)
Chloride: 105 mmol/L (ref 98–111)
Creatinine, Ser: 0.9 mg/dL (ref 0.61–1.24)
GFR, Estimated: >60 mL/min (ref >60)
Glucose, Bld: 106 mg/dL — ABNORMAL HIGH (ref 70–99)
Potassium: 4.3 mmol/L (ref 3.5–5.1)
Sodium: 137 mmol/L (ref 135–145)

## 2023-07-06 LAB — TYPE AND SCREEN
ABO/RH(D): A NEG
Antibody Screen: NEGATIVE

## 2023-07-06 LAB — SURGICAL PCR SCREEN
MRSA, PCR: NEGATIVE
Staphylococcus aureus: NEGATIVE

## 2023-07-11 NOTE — Progress Notes (Addendum)
Anesthesia Chart Review   Case: [id number] Date/Time: 07/17/23 1300   Procedure: TOTAL HIP ARTHROPLASTY ANTERIOR APPROACH (Left: Hip)   Anesthesia type: Choice   Pre-op diagnosis: left hip avascular necrosis   Location: [facility] ROOM 10 / [facility]   Surgeons: Ollen Gross, MD       DISCUSSION:77 y.o. former smoker with h/o asthma, prostate cancer, mild AS, left hip AVN scheduled for above procedure 07/17/23 with Dr. Ollen Gross.   Clearance received from PCP which states pt is low risk for planned procedure. Clearance on chart.   Pt last seen by pulmonology 02/02/2023, stable at this visit.   Pt last seen by cardiology 01/31/2023, follows with cardiology for tachycardia. Per OV note pt stable, 12 month follow up recommended.    Transthoracic echocardiogram 02/24/2022 There is mild concentric left ventricular hypertrophy. Left ventricular systolic function is normal. EF= 75% by 2D biplane method. No regional wall motion abnormalities noted. Grade I diastolic dysfunction, (abnormal relaxation pattern). Diastolic examination suggests elevated left atrial pressure. The E/e'ratio is 14.3. Mild valvular aortic stenosis. The peak aortic valve velocity 279 cm/s Aortic max pressure gradient= 31 mmHg The calculated aortic valve area using the continuity equation is 2.1 cm2. There is trace mitral regurgitation. There is trace tricuspid regurgitation. Right ventricular systolic pressure is estimated at 15-20 mmHg. Mild aortic regurgitation.   Nuclear medicine stress testing December 28, 2016: CONCLUSIONS 1. Myocardial perfusion stress study is normal. There is no evidence for  infarct  or myocardial ischemia. 2. Overall left ventricular systolic function is normal. TID 0.67. The  overall  calculated left ventricular ejection fraction is 80%. No left ventricular  regional wall motion abnormalities. 3. Regadenoson stress EKG is normal with no evidence of ischemia and no chest  pain during  infusion. Isolated PVC noted. 4. Low cardiovascular risk study.  VS: BP 132/79   Pulse 93   Temp 36.8 C (Oral)   Resp 20   Ht 5\' 11"  (1.803 m)   Wt 81.6 kg   SpO2 99%   BMI 25.10 kg/m   PROVIDERS: Paulina Fusi, MD   LABS: Labs reviewed: Acceptable for surgery. (all labs ordered are listed, but only abnormal results are displayed)  Labs Reviewed  BASIC METABOLIC PANEL - Abnormal; Notable for the following components:      Result Value   Glucose, Bld 106 (*)    All other components within normal limits  CBC - Abnormal; Notable for the following components:   RBC 4.06 (*)    Hemoglobin 12.3 (*)    All other components within normal limits  SURGICAL PCR SCREEN  TYPE AND SCREEN     IMAGES:   EKG:   CV:  Past Medical History:  Diagnosis Date   Arthritis    Asthma    Dyspnea    Emphysema lung (HCC)    GERD (gastroesophageal reflux disease)    History of radiation therapy    Prostate cancer   Hyperlipidemia    Prostate cancer (HCC)    Skin cancer    Tachycardia     Past Surgical History:  Procedure Laterality Date   APPENDECTOMY     CLAVICLE SURGERY     Collar bone   EYE SURGERY     LEG SURGERY Right    PROSTATE BIOPSY     SKIN CANCER EXCISION      MEDICATIONS:  albuterol (VENTOLIN HFA) 108 (90 Base) MCG/ACT inhaler   atorvastatin (LIPITOR) 20 MG tablet   diltiazem (CARDIZEM CD)  180 MG 24 hr capsule   Emollient (EUCERIN) lotion   fluticasone-salmeterol (ADVAIR) 250-50 MCG/ACT AEPB   NON FORMULARY   omeprazole (PRILOSEC) 40 MG capsule   Propylene Glycol (SYSTANE BALANCE) 0.6 % SOLN   tamsulosin (FLOMAX) 0.4 MG CAPS capsule   No current facility-administered medications for this encounter.     Jodell Cipro Ward, PA-C [facility] [phone]

## 2023-07-11 NOTE — Anesthesia Preprocedure Evaluation (Addendum)
Anesthesia Evaluation  Patient identified by MRN, date of birth, ID band Patient awake    Reviewed: Allergy & Precautions, H&P , NPO status , Patient's Chart, lab work & pertinent test results  Airway Mallampati: II  TM Distance: >3 FB Neck ROM: Full    Dental no notable dental hx.    Pulmonary shortness of breath, COPD, former smoker   Pulmonary exam normal breath sounds clear to auscultation       Cardiovascular negative cardio ROS Normal cardiovascular exam Rhythm:Regular Rate:Normal     Neuro/Psych negative neurological ROS  negative psych ROS   GI/Hepatic Neg liver ROS,GERD  ,,  Endo/Other  negative endocrine ROS    Renal/GU negative Renal ROS  negative genitourinary   Musculoskeletal  (+) Arthritis , Osteoarthritis,    Abdominal   Peds negative pediatric ROS (+)  Hematology negative hematology ROS (+)   Anesthesia Other Findings   Reproductive/Obstetrics negative OB ROS                             Anesthesia Physical Anesthesia Plan  ASA: 3  Anesthesia Plan: Spinal   Post-op Pain Management: Minimal or no pain anticipated   Induction: Intravenous  PONV Risk Score and Plan: 1 and Ondansetron, Propofol infusion and Treatment may vary due to age or medical condition  Airway Management Planned: Simple Face Mask  Additional Equipment:   Intra-op Plan:   Post-operative Plan:   Informed Consent: I have reviewed the patients History and Physical, chart, labs and discussed the procedure including the risks, benefits and alternatives for the proposed anesthesia with the patient or authorized representative who has indicated his/her understanding and acceptance.     Dental advisory given  Plan Discussed with: CRNA and Surgeon  Anesthesia Plan Comments: (See PAT note 07/06/2023)       Anesthesia Quick Evaluation

## 2023-07-14 NOTE — Progress Notes (Signed)
Called patient to update new arrival time of 1015 for scheduled surgery and to stop liquids at 0940. Pt verbalized understanding.

## 2023-07-17 ENCOUNTER — Encounter (HOSPITAL_COMMUNITY)
Admission: RE | Disposition: A | Discharge status: Home / Self Care | Payer: Self-pay | Source: Home / Self Care | Attending: Orthopedic Surgery

## 2023-07-17 ENCOUNTER — Observation Stay (HOSPITAL_COMMUNITY): Payer: No Typology Code available for payment source

## 2023-07-17 ENCOUNTER — Other Ambulatory Visit: Payer: Self-pay

## 2023-07-17 ENCOUNTER — Encounter (HOSPITAL_COMMUNITY): Payer: Self-pay | Admitting: Orthopedic Surgery

## 2023-07-17 ENCOUNTER — Ambulatory Visit (HOSPITAL_COMMUNITY): Payer: No Typology Code available for payment source

## 2023-07-17 ENCOUNTER — Observation Stay (HOSPITAL_COMMUNITY)
Admission: RE | Admit: 2023-07-17 | Discharge: 2023-07-18 | Disposition: A | Discharge status: Home / Self Care | Payer: No Typology Code available for payment source | Attending: Orthopedic Surgery | Admitting: Orthopedic Surgery

## 2023-07-17 ENCOUNTER — Ambulatory Visit (HOSPITAL_COMMUNITY): Payer: No Typology Code available for payment source | Admitting: Physician Assistant

## 2023-07-17 ENCOUNTER — Ambulatory Visit (HOSPITAL_COMMUNITY): Payer: No Typology Code available for payment source | Admitting: Anesthesiology

## 2023-07-17 DIAGNOSIS — Z85828 Personal history of other malignant neoplasm of skin: Secondary | ICD-10-CM | POA: Insufficient documentation

## 2023-07-17 DIAGNOSIS — Z8546 Personal history of malignant neoplasm of prostate: Secondary | ICD-10-CM | POA: Diagnosis not present

## 2023-07-17 DIAGNOSIS — J449 Chronic obstructive pulmonary disease, unspecified: Secondary | ICD-10-CM | POA: Diagnosis not present

## 2023-07-17 DIAGNOSIS — Z87891 Personal history of nicotine dependence: Secondary | ICD-10-CM

## 2023-07-17 DIAGNOSIS — M1612 Unilateral primary osteoarthritis, left hip: Secondary | ICD-10-CM

## 2023-07-17 DIAGNOSIS — J45909 Unspecified asthma, uncomplicated: Secondary | ICD-10-CM | POA: Insufficient documentation

## 2023-07-17 DIAGNOSIS — M169 Osteoarthritis of hip, unspecified: Secondary | ICD-10-CM | POA: Diagnosis present

## 2023-07-17 HISTORY — PX: TOTAL HIP ARTHROPLASTY: SHX124

## 2023-07-17 LAB — ABO/RH: ABO/RH(D): A NEG

## 2023-07-17 SURGERY — ARTHROPLASTY, HIP, TOTAL, ANTERIOR APPROACH
Anesthesia: Spinal | Site: Hip | Laterality: Left

## 2023-07-17 MED ORDER — ALBUTEROL SULFATE (2.5 MG/3ML) 0.083% IN NEBU: 2.5000 mg | INHALATION_SOLUTION | Freq: Four times a day (QID) | RESPIRATORY_TRACT | Status: DC | PRN

## 2023-07-17 MED ORDER — DILTIAZEM HCL ER COATED BEADS 180 MG PO CP24
180.0000 mg | ORAL_CAPSULE | Freq: Every day | ORAL | Status: DC
  Administered 2023-07-18: 180 mg via ORAL
  Filled 2023-07-17: qty 1

## 2023-07-17 MED ORDER — ONDANSETRON HCL 4 MG/2ML IJ SOLN: 4.0000 mg | Freq: Four times a day (QID) | INTRAMUSCULAR | Status: DC | PRN

## 2023-07-17 MED ORDER — TAMSULOSIN HCL 0.4 MG PO CAPS
0.4000 mg | ORAL_CAPSULE | Freq: Two times a day (BID) | ORAL | Status: DC
  Administered 2023-07-17 – 2023-07-18 (×2): 0.4 mg via ORAL
  Filled 2023-07-17 (×2): qty 1

## 2023-07-17 MED ORDER — 0.9 % SODIUM CHLORIDE (POUR BTL) OPTIME
TOPICAL | Status: DC | PRN
  Administered 2023-07-17: 1000 mL

## 2023-07-17 MED ORDER — RIVAROXABAN 10 MG PO TABS
10.0000 mg | ORAL_TABLET | Freq: Every day | ORAL | Status: DC
  Administered 2023-07-18: 10 mg via ORAL
  Filled 2023-07-17: qty 1

## 2023-07-17 MED ORDER — METHOCARBAMOL 500 MG IVPB - SIMPLE MED
INTRAVENOUS | Status: AC
  Administered 2023-07-17: 500 mg via INTRAVENOUS
  Filled 2023-07-17: qty 55

## 2023-07-17 MED ORDER — LACTATED RINGERS IV SOLN: INTRAVENOUS | Status: DC | PRN

## 2023-07-17 MED ORDER — METOCLOPRAMIDE HCL 5 MG/ML IJ SOLN: 5.0000 mg | Freq: Three times a day (TID) | INTRAMUSCULAR | Status: DC | PRN

## 2023-07-17 MED ORDER — ATORVASTATIN CALCIUM 20 MG PO TABS: 20.0000 mg | ORAL_TABLET | Freq: Every day | ORAL | Status: DC

## 2023-07-17 MED ORDER — PHENOL 1.4 % MT LIQD: 1.0000 | OROMUCOSAL | Status: DC | PRN

## 2023-07-17 MED ORDER — PHENYLEPHRINE HCL (PRESSORS) 10 MG/ML IV SOLN
INTRAVENOUS | Status: DC | PRN
  Administered 2023-07-17: 80 ug via INTRAVENOUS

## 2023-07-17 MED ORDER — BUPIVACAINE-EPINEPHRINE (PF) 0.25% -1:200000 IJ SOLN
INTRAMUSCULAR | Status: DC | PRN
  Administered 2023-07-17: 30 mL

## 2023-07-17 MED ORDER — METHOCARBAMOL 500 MG PO TABS
500.0000 mg | ORAL_TABLET | Freq: Four times a day (QID) | ORAL | Status: DC | PRN
  Administered 2023-07-17 – 2023-07-18 (×2): 500 mg via ORAL
  Filled 2023-07-17 (×3): qty 1

## 2023-07-17 MED ORDER — ONDANSETRON HCL 4 MG/2ML IJ SOLN
INTRAMUSCULAR | Status: DC | PRN
  Administered 2023-07-17: 4 mg via INTRAVENOUS

## 2023-07-17 MED ORDER — MOMETASONE FURO-FORMOTEROL FUM 200-5 MCG/ACT IN AERO
2.0000 | INHALATION_SPRAY | Freq: Two times a day (BID) | RESPIRATORY_TRACT | Status: DC
  Administered 2023-07-17 – 2023-07-18 (×2): 2 via RESPIRATORY_TRACT
  Filled 2023-07-17: qty 8.8

## 2023-07-17 MED ORDER — TRANEXAMIC ACID-NACL 1000-0.7 MG/100ML-% IV SOLN
1000.0000 mg | INTRAVENOUS | Status: AC
  Administered 2023-07-17: 1000 mg via INTRAVENOUS
  Filled 2023-07-17: qty 100

## 2023-07-17 MED ORDER — POVIDONE-IODINE 10 % EX SWAB: 2.0000 | Freq: Once | CUTANEOUS | Status: AC

## 2023-07-17 MED ORDER — PHENYLEPHRINE HCL-NACL 20-0.9 MG/250ML-% IV SOLN
INTRAVENOUS | Status: DC | PRN
  Administered 2023-07-17: 25 ug/min via INTRAVENOUS

## 2023-07-17 MED ORDER — TRAMADOL HCL 50 MG PO TABS
50.0000 mg | ORAL_TABLET | Freq: Four times a day (QID) | ORAL | Status: DC | PRN
  Administered 2023-07-17: 100 mg via ORAL
  Filled 2023-07-17 (×2): qty 1

## 2023-07-17 MED ORDER — PROPOFOL 500 MG/50ML IV EMUL
INTRAVENOUS | Status: DC | PRN
  Administered 2023-07-17: 75 ug/kg/min via INTRAVENOUS

## 2023-07-17 MED ORDER — CEFAZOLIN SODIUM-DEXTROSE 2-4 GM/100ML-% IV SOLN
2.0000 g | INTRAVENOUS | Status: AC
  Administered 2023-07-17: 2 g via INTRAVENOUS
  Filled 2023-07-17: qty 100

## 2023-07-17 MED ORDER — HYDROMORPHONE HCL 1 MG/ML IJ SOLN
INTRAMUSCULAR | Status: AC
  Administered 2023-07-17: 0.5 mg via INTRAVENOUS
  Filled 2023-07-17: qty 1

## 2023-07-17 MED ORDER — HYDROCODONE-ACETAMINOPHEN 7.5-325 MG PO TABS: 1.0000 | ORAL_TABLET | ORAL | Status: DC | PRN

## 2023-07-17 MED ORDER — ORAL CARE MOUTH RINSE: 15.0000 mL | Freq: Once | OROMUCOSAL | Status: AC

## 2023-07-17 MED ORDER — BISACODYL 10 MG RE SUPP: 10.0000 mg | Freq: Every day | RECTAL | Status: DC | PRN

## 2023-07-17 MED ORDER — POLYETHYLENE GLYCOL 3350 17 G PO PACK: 17.0000 g | PACK | Freq: Every day | ORAL | Status: DC | PRN

## 2023-07-17 MED ORDER — LACTATED RINGERS IV SOLN: INTRAVENOUS | Status: DC

## 2023-07-17 MED ORDER — CEFAZOLIN SODIUM-DEXTROSE 2-4 GM/100ML-% IV SOLN
2.0000 g | Freq: Four times a day (QID) | INTRAVENOUS | Status: AC
  Administered 2023-07-17 – 2023-07-18 (×2): 2 g via INTRAVENOUS
  Filled 2023-07-17 (×2): qty 100

## 2023-07-17 MED ORDER — OXYCODONE HCL 5 MG PO TABS: 5.0000 mg | ORAL_TABLET | Freq: Once | ORAL | Status: DC | PRN

## 2023-07-17 MED ORDER — PHENYLEPHRINE HCL (PRESSORS) 10 MG/ML IV SOLN
INTRAVENOUS | Status: AC
  Filled 2023-07-17: qty 1

## 2023-07-17 MED ORDER — BUPIVACAINE IN DEXTROSE 0.75-8.25 % IT SOLN
INTRATHECAL | Status: DC | PRN
  Administered 2023-07-17: 1.6 mL via INTRATHECAL

## 2023-07-17 MED ORDER — SODIUM CHLORIDE 0.9 % IV SOLN: INTRAVENOUS | Status: DC

## 2023-07-17 MED ORDER — DOCUSATE SODIUM 100 MG PO CAPS
100.0000 mg | ORAL_CAPSULE | Freq: Two times a day (BID) | ORAL | Status: DC
  Administered 2023-07-17 – 2023-07-18 (×2): 100 mg via ORAL
  Filled 2023-07-17 (×2): qty 1

## 2023-07-17 MED ORDER — METOCLOPRAMIDE HCL 5 MG PO TABS: 5.0000 mg | ORAL_TABLET | Freq: Three times a day (TID) | ORAL | Status: DC | PRN

## 2023-07-17 MED ORDER — HYDROCODONE-ACETAMINOPHEN 5-325 MG PO TABS
1.0000 | ORAL_TABLET | ORAL | Status: DC | PRN
  Administered 2023-07-17 – 2023-07-18 (×2): 1 via ORAL
  Administered 2023-07-18: 2 via ORAL
  Filled 2023-07-17: qty 1
  Filled 2023-07-17: qty 2
  Filled 2023-07-17: qty 1

## 2023-07-17 MED ORDER — OXYCODONE HCL 5 MG/5ML PO SOLN: 5.0000 mg | Freq: Once | ORAL | Status: DC | PRN

## 2023-07-17 MED ORDER — CHLORHEXIDINE GLUCONATE 0.12 % MT SOLN
15.0000 mL | Freq: Once | OROMUCOSAL | Status: AC
  Administered 2023-07-17: 15 mL via OROMUCOSAL

## 2023-07-17 MED ORDER — ACETAMINOPHEN 325 MG PO TABS: 325.0000 mg | ORAL_TABLET | Freq: Four times a day (QID) | ORAL | Status: DC | PRN

## 2023-07-17 MED ORDER — MENTHOL 3 MG MT LOZG: 1.0000 | LOZENGE | OROMUCOSAL | Status: DC | PRN

## 2023-07-17 MED ORDER — DEXAMETHASONE SODIUM PHOSPHATE 10 MG/ML IJ SOLN
8.0000 mg | Freq: Once | INTRAMUSCULAR | Status: AC
  Administered 2023-07-17: 8 mg via INTRAVENOUS

## 2023-07-17 MED ORDER — ACETAMINOPHEN 10 MG/ML IV SOLN
1000.0000 mg | Freq: Four times a day (QID) | INTRAVENOUS | Status: DC
  Administered 2023-07-17: 1000 mg via INTRAVENOUS
  Filled 2023-07-17: qty 100

## 2023-07-17 MED ORDER — PROPOFOL 1000 MG/100ML IV EMUL
INTRAVENOUS | Status: AC
  Filled 2023-07-17: qty 200

## 2023-07-17 MED ORDER — METHOCARBAMOL 500 MG IVPB - SIMPLE MED: 500.0000 mg | Freq: Four times a day (QID) | INTRAVENOUS | Status: DC | PRN

## 2023-07-17 MED ORDER — DEXAMETHASONE SODIUM PHOSPHATE 10 MG/ML IJ SOLN
10.0000 mg | Freq: Once | INTRAMUSCULAR | Status: AC
  Administered 2023-07-18: 10 mg via INTRAVENOUS
  Filled 2023-07-17: qty 1

## 2023-07-17 MED ORDER — ONDANSETRON HCL 4 MG PO TABS: 4.0000 mg | ORAL_TABLET | Freq: Four times a day (QID) | ORAL | Status: DC | PRN

## 2023-07-17 MED ORDER — WATER FOR IRRIGATION, STERILE IR SOLN
Status: DC | PRN
  Administered 2023-07-17: 2000 mL

## 2023-07-17 MED ORDER — BUPIVACAINE-EPINEPHRINE 0.25% -1:200000 IJ SOLN
INTRAMUSCULAR | Status: AC
  Filled 2023-07-17: qty 1

## 2023-07-17 MED ORDER — MORPHINE SULFATE (PF) 2 MG/ML IV SOLN: 0.5000 mg | INTRAVENOUS | Status: DC | PRN

## 2023-07-17 MED ORDER — MAGNESIUM CITRATE PO SOLN: 1.0000 | Freq: Once | ORAL | Status: DC | PRN

## 2023-07-17 MED ORDER — HYDROMORPHONE HCL 1 MG/ML IJ SOLN
0.2500 mg | INTRAMUSCULAR | Status: DC | PRN
  Administered 2023-07-17: 0.5 mg via INTRAVENOUS

## 2023-07-17 MED ORDER — PANTOPRAZOLE SODIUM 40 MG PO TBEC
40.0000 mg | DELAYED_RELEASE_TABLET | Freq: Every day | ORAL | Status: DC
  Administered 2023-07-17 – 2023-07-18 (×2): 40 mg via ORAL
  Filled 2023-07-17 (×2): qty 1

## 2023-07-17 MED ORDER — ONDANSETRON HCL 4 MG/2ML IJ SOLN: 4.0000 mg | Freq: Once | INTRAMUSCULAR | Status: DC | PRN

## 2023-07-17 SURGICAL SUPPLY — 42 items
ADH SKN CLS APL DERMABOND .7 (GAUZE/BANDAGES/DRESSINGS) ×1
BAG COUNTER SPONGE SURGICOUNT (BAG) IMPLANT
BAG SPEC THK2 15X12 ZIP CLS (MISCELLANEOUS)
BAG SPNG CNTER NS LX DISP (BAG)
BAG ZIPLOCK 12X15 (MISCELLANEOUS) IMPLANT
BLADE SAG 18X100X1.27 (BLADE) ×1 IMPLANT
CATH COUDE FOLEY 5CC 14FR (CATHETERS) IMPLANT
COVER PERINEAL POST (MISCELLANEOUS) ×1 IMPLANT
COVER SURGICAL LIGHT HANDLE (MISCELLANEOUS) ×1 IMPLANT
CUP ACETBLR 54 OD PINNACLE (Hips) IMPLANT
DERMABOND ADVANCED .7 DNX12 (GAUZE/BANDAGES/DRESSINGS) ×1 IMPLANT
DRAPE FOOT SWITCH (DRAPES) ×1 IMPLANT
DRAPE STERI IOBAN 125X83 (DRAPES) ×1 IMPLANT
DRAPE U-SHAPE 47X51 STRL (DRAPES) ×2 IMPLANT
DRSG AQUACEL AG ADV 3.5X10 (GAUZE/BANDAGES/DRESSINGS) ×1 IMPLANT
DURAPREP 26ML APPLICATOR (WOUND CARE) ×1 IMPLANT
ELECT REM PT RETURN 15FT ADLT (MISCELLANEOUS) ×1 IMPLANT
GLOVE BIO SURGEON STRL SZ 6.5 (GLOVE) IMPLANT
GLOVE BIO SURGEON STRL SZ8 (GLOVE) ×1 IMPLANT
GLOVE BIOGEL PI IND STRL 6.5 (GLOVE) IMPLANT
GLOVE BIOGEL PI IND STRL 7.0 (GLOVE) IMPLANT
GLOVE BIOGEL PI IND STRL 8 (GLOVE) ×1 IMPLANT
GOWN STRL REUS W/ TWL LRG LVL3 (GOWN DISPOSABLE) ×1 IMPLANT
GOWN STRL REUS W/TWL LRG LVL3 (GOWN DISPOSABLE) ×1
HEAD M SROM 36MM PLUS 1.5 (Hips) IMPLANT
HOLDER FOLEY CATH W/STRAP (MISCELLANEOUS) ×1 IMPLANT
KIT TURNOVER KIT A (KITS) IMPLANT
LINER MARATHON NEUT +4X54X36 (Hips) IMPLANT
MANIFOLD NEPTUNE II (INSTRUMENTS) ×1 IMPLANT
PACK ANTERIOR HIP CUSTOM (KITS) ×1 IMPLANT
PENCIL SMOKE EVACUATOR COATED (MISCELLANEOUS) ×1 IMPLANT
SPIKE FLUID TRANSFER (MISCELLANEOUS) ×1 IMPLANT
SROM M HEAD 36MM PLUS 1.5 (Hips) ×1 IMPLANT
STEM FEMORAL SZ8 STD ACTIS (Stem) IMPLANT
SUT ETHIBOND NAB CT1 #1 30IN (SUTURE) ×1 IMPLANT
SUT MNCRL AB 4-0 PS2 18 (SUTURE) ×1 IMPLANT
SUT STRATAFIX 0 PDS 27 VIOLET (SUTURE) ×1
SUT VIC AB 2-0 CT1 27 (SUTURE) ×2
SUT VIC AB 2-0 CT1 TAPERPNT 27 (SUTURE) ×2 IMPLANT
SUTURE STRATFX 0 PDS 27 VIOLET (SUTURE) ×1 IMPLANT
TRAY FOLEY MTR SLVR 16FR STAT (SET/KITS/TRAYS/PACK) ×1 IMPLANT
TUBE SUCTION HIGH CAP CLEAR NV (SUCTIONS) ×1 IMPLANT

## 2023-07-17 NOTE — Op Note (Signed)
OPERATIVE REPORT- TOTAL HIP ARTHROPLASTY   PREOPERATIVE DIAGNOSIS: Osteoarthritis of the Left hip.   POSTOPERATIVE DIAGNOSIS: Osteoarthritis of the Left  hip.   PROCEDURE: Left total hip arthroplasty, anterior approach.   SURGEON: Ollen Gross, MD   ASSISTANT: Arther Abbott, PA-C  ANESTHESIA:  Spinal  ESTIMATED BLOOD LOSS:-200 mL    DRAINS: None  COMPLICATIONS: None   CONDITION: PACU - hemodynamically stable.   BRIEF CLINICAL NOTE: Todd Koch is a 77 y.o. male who has advanced end-  stage arthritis of their Left  hip with progressively worsening pain and  dysfunction.The patient has failed nonoperative management and presents for  total hip arthroplasty.   PROCEDURE IN DETAIL: After successful administration of spinal  anesthetic, the traction boots for the [facility] bed were placed on both  feet and the patient was placed onto the [facility] bed, boots placed into the leg  holders. The Left hip was then isolated from the perineum with plastic  drapes and prepped and draped in the usual sterile fashion. ASIS and  greater trochanter were marked and a oblique incision was made, starting  at about 1 cm lateral and 2 cm distal to the ASIS and coursing towards  the anterior cortex of the femur. The skin was cut with a 10 blade  through subcutaneous tissue to the level of the fascia overlying the  tensor fascia lata muscle. The fascia was then incised in line with the  incision at the junction of the anterior third and posterior 2/3rd. The  muscle was teased off the fascia and then the interval between the TFL  and the rectus was developed. The Hohmann retractor was then placed at  the top of the femoral neck over the capsule. The vessels overlying the  capsule were cauterized and the fat on top of the capsule was removed.  A Hohmann retractor was then placed anterior underneath the rectus  femoris to give exposure to the entire anterior capsule. A T-shaped  capsulotomy was  performed. The edges were tagged and the femoral head  was identified.       Osteophytes are removed off the superior acetabulum.  The femoral neck was then cut in situ with an oscillating saw. Traction  was then applied to the left lower extremity utilizing the [facility]  traction. The femoral head was then removed. Retractors were placed  around the acetabulum and then circumferential removal of the labrum was  performed. Osteophytes were also removed. Reaming starts at 51 mm to  medialize and  Increased in 2 mm increments to 53 mm. We reamed in  approximately 40 degrees of abduction, 20 degrees anteversion. A 54 mm  pinnacle acetabular shell was then impacted in anatomic position under  fluoroscopic guidance with excellent purchase. We did not need to place  any additional dome screws. A 36 mm neutral + 4 marathon liner was then  placed into the acetabular shell.       The femoral lift was then placed along the lateral aspect of the femur  just distal to the vastus ridge. The leg was  externally rotated and capsule  was stripped off the inferior aspect of the femoral neck down to the  level of the lesser trochanter, this was done with electrocautery. The femur was lifted after this was performed. The  leg was then placed in an extended and adducted position essentially delivering the femur. We also removed the capsule superiorly and the piriformis from the piriformis fossa to gain  excellent exposure of the  proximal femur. Rongeur was used to remove some cancellous bone to get  into the lateral portion of the proximal femur for placement of the  initial starter reamer. The starter broaches was placed  the starter broach  and was shown to go down the center of the canal. Broaching  with the Actis system was then performed starting at size 0  coursing  Up to size 8. A size 8 had excellent torsional and rotational  and axial stability. The trial standard offset neck was then placed  with a 36 +  1.5 trial head. The hip was then reduced. We confirmed that  the stem was in the canal both on AP and lateral x-rays. It also has excellent sizing. The hip was reduced with outstanding stability through full extension and full external rotation.. AP pelvis was taken and the leg lengths were measured and found to be equal. Hip was then dislocated again and the femoral head and neck removed. The  femoral broach was removed. Size 8 Actis stem with a standard offset  neck was then impacted into the femur following native anteversion. Has  excellent purchase in the canal. Excellent torsional and rotational and  axial stability. It is confirmed to be in the canal on AP and lateral  fluoroscopic views. The 36 + 1.5 metal head was placed and the hip  reduced with outstanding stability. Again AP pelvis was taken and it  confirmed that the leg lengths were equal. The wound was then copiously  irrigated with saline solution and the capsule reattached and repaired  with Ethibond suture. 30 ml of .25% Bupivicaine was  injected into the capsule and into the edge of the tensor fascia lata as well as subcutaneous tissue. The fascia overlying the tensor fascia lata was then closed with a running #1 V-Loc. Subcu was closed with interrupted 2-0 Vicryl and subcuticular running 4-0 Monocryl. Incision was cleaned  and dried. Steri-Strips and a bulky sterile dressing applied. The patient was awakened and transported to  recovery in stable condition.        Please note that a surgical assistant was a medical necessity for this procedure to perform it in a safe and expeditious manner. Assistant was necessary to provide appropriate retraction of vital neurovascular structures and to prevent femoral fracture and allow for anatomic placement of the prosthesis.  Ollen Gross, M.D.

## 2023-07-17 NOTE — Anesthesia Postprocedure Evaluation (Signed)
Anesthesia Post Note  Patient: Todd Koch  Procedure(s) Performed: TOTAL HIP ARTHROPLASTY ANTERIOR APPROACH (Left: Hip)     Patient location during evaluation: PACU Anesthesia Type: Spinal Level of consciousness: oriented and awake and alert Pain management: pain level controlled Vital Signs Assessment: post-procedure vital signs reviewed and stable Respiratory status: spontaneous breathing, respiratory function stable and patient connected to nasal cannula oxygen Cardiovascular status: blood pressure returned to baseline and stable Postop Assessment: no headache, no backache and no apparent nausea or vomiting Anesthetic complications: no  No notable events documented.  Last Vitals:  Vitals:   07/17/23 1615 07/17/23 1632  BP: (!) 103/55 130/65  Pulse: 61 79  Resp: 11   Temp:  36.8 C  SpO2: 97%     Last Pain:  Vitals:   07/17/23 1632  TempSrc: Oral  PainSc:                  [name] S

## 2023-07-17 NOTE — Discharge Instructions (Addendum)
Todd Arabian, MD Total Joint Specialist [facility] [address], [address] [place], [place] [address] [phone]  ANTERIOR APPROACH TOTAL HIP REPLACEMENT POSTOPERATIVE DIRECTIONS     Hip Rehabilitation, Guidelines Following Surgery  The results of a hip operation are greatly improved after range of motion and muscle strengthening exercises. Follow all safety measures which are given to protect your hip. If any of these exercises cause increased pain or swelling in your joint, decrease the amount until you are comfortable again. Then slowly increase the exercises. Call your caregiver if you have problems or questions.   BLOOD CLOT PREVENTION Take a 10 mg Xarelto once a day for three weeks following surgery. Then take an 81 mg Aspirin once a day for three weeks. Then discontinue Aspirin. You may resume your vitamins/supplements once you have discontinued the Xarelto. Do not take any NSAIDs (Advil, Aleve, Ibuprofen, Meloxicam, etc.) until you have discontinued the Xarelto.   Information on my medicine - XARELTO (Rivaroxaban)  Why was Xarelto prescribed for you? Xarelto was prescribed for you to reduce the risk of blood clots forming after orthopedic surgery. The medical term for these abnormal blood clots is venous thromboembolism (VTE).  What do you need to know about xarelto ? Take your Xarelto ONCE DAILY at the same time every day. You may take it either with or without food.  If you have difficulty swallowing the tablet whole, you may crush it and mix in applesauce just prior to taking your dose.  Take Xarelto exactly as prescribed by your doctor and DO NOT stop taking Xarelto without talking to the doctor who prescribed the medication.  Stopping without other VTE prevention medication to take the place of Xarelto may increase your risk of developing a clot.  After discharge, you should have regular check-up appointments with your healthcare provider  that is prescribing your Xarelto.    What do you do if you miss a dose? If you miss a dose, take it as soon as you remember on the same day then continue your regularly scheduled once daily regimen the next day. Do not take two doses of Xarelto on the same day.   Important Safety Information A possible side effect of Xarelto is bleeding. You should call your healthcare provider right away if you experience any of the following: Bleeding from an injury or your nose that does not stop. Unusual colored urine (red or dark brown) or unusual colored stools (red or black). Unusual bruising for unknown reasons. A serious fall or if you hit your head (even if there is no bleeding).  Some medicines may interact with Xarelto and might increase your risk of bleeding while on Xarelto. To help avoid this, consult your healthcare provider or pharmacist prior to using any new prescription or non-prescription medications, including herbals, vitamins, non-steroidal anti-inflammatory drugs (NSAIDs) and supplements.  This website has more information on Xarelto: [email or web address]-benson.[email or web address]/.    HOME CARE INSTRUCTIONS  Remove items at home which could result in a fall. This includes throw rugs or furniture in walking pathways.  ICE to the affected hip as frequently as 20-30 minutes an hour and then as needed for pain and swelling. Continue to use ice on the hip for pain and swelling from surgery. You may notice swelling that will progress down to the foot and ankle. This is normal after surgery. Elevate the leg when you are not up walking on it.   Continue to use the breathing machine which will help keep your  temperature down.  It is common for your temperature to cycle up and down following surgery, especially at night when you are not up moving around and exerting yourself.  The breathing machine keeps your lungs expanded and your temperature down.  DIET You may resume your previous home diet once your are discharged  from the hospital.  DRESSING / WOUND CARE / SHOWERING You have an adhesive waterproof bandage over the incision. Leave this in place until your first follow-up appointment. Once you remove this you will not need to place another bandage.  You may begin showering 3 days following surgery, but do not submerge the incision under water.  ACTIVITY For the first 3-5 days, it is important to rest and keep the operative leg elevated. You should, as a general rule, rest for 50 minutes and walk/stretch for 10 minutes per hour. After 5 days, you may slowly increase activity as tolerated.  Perform the exercises you were provided twice a day for about 15-20 minutes each session. Begin these 2 days following surgery. Walk with your walker as instructed. Use the walker until you are comfortable transitioning to a cane. Walk with the cane in the opposite hand of the operative leg. You may discontinue the cane once you are comfortable and walking steadily. Avoid periods of inactivity such as sitting longer than an hour when not asleep. This helps prevent blood clots.  Do not drive a car for 6 weeks or until released by your surgeon.  Do not drive while taking narcotics.  TED HOSE STOCKINGS Wear the elastic stockings on both legs for three weeks following surgery during the day. You may remove them at night while sleeping.  WEIGHT BEARING Weight bearing as tolerated with assist device (walker, cane, etc) as directed, use it as long as suggested by your surgeon or therapist, typically at least 4-6 weeks.  POSTOPERATIVE CONSTIPATION PROTOCOL Constipation - defined medically as fewer than three stools per week and severe constipation as less than one stool per week.  One of the most common issues patients have following surgery is constipation.  Even if you have a regular bowel pattern at home, your normal regimen is likely to be disrupted due to multiple reasons following surgery.  Combination of anesthesia,  postoperative narcotics, change in appetite and fluid intake all can affect your bowels.  In order to avoid complications following surgery, here are some recommendations in order to help you during your recovery period.  Colace (docusate) - Pick up an over-the-counter form of Colace or another stool softener and take twice a day as long as you are requiring postoperative pain medications.  Take with a full glass of water daily.  If you experience loose stools or diarrhea, hold the colace until you stool forms back up.  If your symptoms do not get better within 1 week or if they get worse, check with your doctor. Dulcolax (bisacodyl) - Pick up over-the-counter and take as directed by the product packaging as needed to assist with the movement of your bowels.  Take with a full glass of water.  Use this product as needed if not relieved by Colace only.  MiraLax (polyethylene glycol) - Pick up over-the-counter to have on hand.  MiraLax is a solution that will increase the amount of water in your bowels to assist with bowel movements.  Take as directed and can mix with a glass of water, juice, soda, coffee, or tea.  Take if you go more than two days without a  movement.Do not use MiraLax more than once per day. Call your doctor if you are still constipated or irregular after using this medication for 7 days in a row.  If you continue to have problems with postoperative constipation, please contact the office for further assistance and recommendations.  If you experience "the worst abdominal pain ever" or develop nausea or vomiting, please contact the office immediatly for further recommendations for treatment.  ITCHING  If you experience itching with your medications, try taking only a single pain pill, or even half a pain pill at a time.  You can also use Benadryl over the counter for itching or also to help with sleep.   MEDICATIONS See your medication summary on the "After Visit Summary" that the nursing  staff will review with you prior to discharge.  You may have some home medications which will be placed on hold until you complete the course of blood thinner medication.  It is important for you to complete the blood thinner medication as prescribed by your surgeon.  Continue your approved medications as instructed at time of discharge.  PRECAUTIONS If you experience chest pain or shortness of breath - call 911 immediately for transfer to the hospital emergency department.  If you develop a fever greater that 101 F, purulent drainage from wound, increased redness or drainage from wound, foul odor from the wound/dressing, or calf pain - CONTACT YOUR SURGEON.                                                   FOLLOW-UP APPOINTMENTS Make sure you keep all of your appointments after your operation with your surgeon and caregivers. You should call the office at the above phone number and make an appointment for approximately two weeks after the date of your surgery or on the date instructed by your surgeon outlined in the "After Visit Summary".  RANGE OF MOTION AND STRENGTHENING EXERCISES  These exercises are designed to help you keep full movement of your hip joint. Follow your caregiver's or physical therapist's instructions. Perform all exercises about fifteen times, three times per day or as directed. Exercise both hips, even if you have had only one joint replacement. These exercises can be done on a training (exercise) mat, on the floor, on a table or on a bed. Use whatever works the best and is most comfortable for you. Use music or television while you are exercising so that the exercises are a pleasant break in your day. This will make your life better with the exercises acting as a break in routine you can look forward to.  Lying on your back, slowly slide your foot toward your buttocks, raising your knee up off the floor. Then slowly slide your foot back down until your leg is straight again.   Lying on your back spread your legs as far apart as you can without causing discomfort.  Lying on your side, raise your upper leg and foot straight up from the floor as far as is comfortable. Slowly lower the leg and repeat.  Lying on your back, tighten up the muscle in the front of your thigh (quadriceps muscles). You can do this by keeping your leg straight and trying to raise your heel off the floor. This helps strengthen the largest muscle supporting your knee.  Lying on your back, tighten  up the muscles of your buttocks both with the legs straight and with the knee bent at a comfortable angle while keeping your heel on the floor.   POST-OPERATIVE OPIOID TAPER INSTRUCTIONS: It is important to wean off of your opioid medication as soon as possible. If you do not need pain medication after your surgery it is ok to stop day one. Opioids include: Codeine, Hydrocodone(Norco, Vicodin), Oxycodone(Percocet, oxycontin) and hydromorphone amongst others.  Long term and even short term use of opiods can cause: Increased pain response Dependence Constipation Depression Respiratory depression And more.  Withdrawal symptoms can include Flu like symptoms Nausea, vomiting And more Techniques to manage these symptoms Hydrate well Eat regular healthy meals Stay active Use relaxation techniques(deep breathing, meditating, yoga) Do Not substitute Alcohol to help with tapering If you have been on opioids for less than two weeks and do not have pain than it is ok to stop all together.  Plan to wean off of opioids This plan should start within one week post op of your joint replacement. Maintain the same interval or time between taking each dose and first decrease the dose.  Cut the total daily intake of opioids by one tablet each day Next start to increase the time between doses. The last dose that should be eliminated is the evening dose.   IF YOU ARE TRANSFERRED TO A SKILLED REHAB FACILITY If the  patient is transferred to a skilled rehab facility following release from the hospital, a list of the current medications will be sent to the facility for the patient to continue.  When discharged from the skilled rehab facility, please have the facility set up the patient's [place] prior to being released. Also, the skilled facility will be responsible for providing the patient with their medications at time of release from the facility to include their pain medication, the muscle relaxants, and their blood thinner medication. If the patient is still at the rehab facility at time of the two week follow up appointment, the skilled rehab facility will also need to assist the patient in arranging follow up appointment in our office and any transportation needs.  MAKE SURE YOU:  Understand these instructions.  Get help right away if you are not doing well or get worse.    DENTAL ANTIBIOTICS:  In most cases prophylactic antibiotics for Dental procdeures after total joint surgery are not necessary.  Exceptions are as follows:  1. History of prior total joint infection  2. Severely immunocompromised (Organ Transplant, cancer chemotherapy, Rheumatoid biologic meds such as [place])  3. Poorly controlled diabetes (A1C &gt; 8.0, blood glucose over 200)  If you have one of these conditions, contact your surgeon for an antibiotic prescription, prior to your dental procedure.    Pick up stool softner and laxative for home use following surgery while on pain medications. Do not submerge incision under water. Please use good hand washing techniques while changing dressing each day. May shower starting three days after surgery. Please use a clean towel to pat the incision dry following showers. Continue to use ice for pain and swelling after surgery. Do not use any lotions or creams on the incision until instructed by your surgeon.

## 2023-07-17 NOTE — Transfer of Care (Signed)
Immediate Anesthesia Transfer of Care Note  Patient: Todd Koch  Procedure(s) Performed: TOTAL HIP ARTHROPLASTY ANTERIOR APPROACH (Left: Hip)  Patient Location: PACU  Anesthesia Type:MAC combined with regional for post-op pain  Level of Consciousness: awake and alert   Airway & Oxygen Therapy: Patient connected to face mask oxygen  Post-op Assessment: Report given to RN and Post -op Vital signs reviewed and stable  Post vital signs: Reviewed and stable  Last Vitals:  Vitals Value Taken Time  BP 104/54 07/17/23 1450  Temp 36.4 C 07/17/23 1450  Pulse 81 07/17/23 1452  Resp 13 07/17/23 1452  SpO2 100 % 07/17/23 1452  Vitals shown include unfiled device data.  Last Pain:  Vitals:   07/17/23 1020  TempSrc:   PainSc: 0-No pain         Complications: No notable events documented.

## 2023-07-17 NOTE — Anesthesia Procedure Notes (Signed)
Spinal  Patient location during procedure: OR Start time: 07/17/2023 1:20 PM End time: 07/17/2023 1:25 PM Reason for block: surgical anesthesia Staffing Performed: anesthesiologist  Anesthesiologist: Eilene Ghazi, MD Performed by: Eilene Ghazi, MD Authorized by: Eilene Ghazi, MD   Preanesthetic Checklist Completed: patient identified, IV checked, site marked, risks and benefits discussed, surgical consent, monitors and equipment checked, pre-op evaluation and timeout performed Spinal Block Patient position: sitting Prep: Betadine Patient monitoring: heart rate, continuous pulse ox and blood pressure Approach: midline Location: L4-5 Injection technique: single-shot Needle Needle type: Sprotte  Needle gauge: 24 G Needle length: 9 cm Assessment Sensory level: T6 Events: CSF return and second provider Additional Notes

## 2023-07-17 NOTE — Interval H&P Note (Signed)
History and Physical Interval Note:  07/17/2023 10:46 AM  Todd Koch  has presented today for surgery, with the diagnosis of left hip avascular necrosis.  The various methods of treatment have been discussed with the patient and family. After consideration of risks, benefits and other options for treatment, the patient has consented to  Procedure(s): TOTAL HIP ARTHROPLASTY ANTERIOR APPROACH (Left) as a surgical intervention.  The patient's history has been reviewed, patient examined, no change in status, stable for surgery.  I have reviewed the patient's chart and labs.  Questions were answered to the patient's satisfaction.     [name]

## 2023-07-17 NOTE — Plan of Care (Signed)
  Problem: Activity: Goal: Ability to avoid complications of mobility impairment will improve Outcome: Progressing Goal: Ability to tolerate increased activity will improve Outcome: Progressing   Problem: Pain Management: Goal: Pain level will decrease with appropriate interventions Outcome: Progressing   Problem: Nutrition: Goal: Adequate nutrition will be maintained Outcome: Progressing   Problem: Safety: Goal: Ability to remain free from injury will improve Outcome: Progressing

## 2023-07-18 DIAGNOSIS — M1612 Unilateral primary osteoarthritis, left hip: Secondary | ICD-10-CM | POA: Diagnosis not present

## 2023-07-18 LAB — BASIC METABOLIC PANEL
Anion gap: 6 (ref 5–15)
BUN: 15 mg/dL (ref 8–23)
CO2: 24 mmol/L (ref 22–32)
Calcium: 8.5 mg/dL — ABNORMAL LOW (ref 8.9–10.3)
Chloride: 107 mmol/L (ref 98–111)
Creatinine, Ser: 0.89 mg/dL (ref 0.61–1.24)
GFR, Estimated: >60 mL/min (ref >60)
Glucose, Bld: 184 mg/dL — ABNORMAL HIGH (ref 70–99)
Potassium: 3.8 mmol/L (ref 3.5–5.1)
Sodium: 137 mmol/L (ref 135–145)

## 2023-07-18 LAB — CBC
HCT: 32 % — ABNORMAL LOW (ref 39.0–52.0)
Hemoglobin: 10 g/dL — ABNORMAL LOW (ref 13.0–17.0)
MCH: 30.6 pg (ref 26.0–34.0)
MCHC: 31.3 g/dL (ref 30.0–36.0)
MCV: 97.9 fL (ref 80.0–100.0)
Platelets: 201 10*3/uL (ref 150–400)
RBC: 3.27 MIL/uL — ABNORMAL LOW (ref 4.22–5.81)
RDW: 14.3 % (ref 11.5–15.5)
WBC: 10.6 10*3/uL — ABNORMAL HIGH (ref 4.0–10.5)
nRBC: 0 % (ref 0.0–0.2)

## 2023-07-18 MED ORDER — HYDROCODONE-ACETAMINOPHEN 5-325 MG PO TABS: 1.0000 | ORAL_TABLET | Freq: Three times a day (TID) | ORAL | 0 refills | Status: AC | PRN

## 2023-07-18 MED ORDER — METHOCARBAMOL 500 MG PO TABS: 500.0000 mg | ORAL_TABLET | Freq: Four times a day (QID) | ORAL | 0 refills | Status: AC | PRN

## 2023-07-18 MED ORDER — RIVAROXABAN 10 MG PO TABS: 10.0000 mg | ORAL_TABLET | Freq: Every day | ORAL | 0 refills | Status: AC

## 2023-07-18 MED ORDER — TRAMADOL HCL 50 MG PO TABS: 50.0000 mg | ORAL_TABLET | Freq: Four times a day (QID) | ORAL | 0 refills | Status: AC | PRN

## 2023-07-18 MED ORDER — ONDANSETRON HCL 4 MG PO TABS: 4.0000 mg | ORAL_TABLET | Freq: Four times a day (QID) | ORAL | 0 refills | Status: AC | PRN

## 2023-07-18 NOTE — Evaluation (Signed)
Physical Therapy Evaluation Patient Details Name: Todd Koch MRN: [id number] DOB: 11/03/1946 Today's Date: 07/18/2023  History of Present Illness  77 yo male s/p L THA, AA on 07/17/23. no significant medical hx per chart; pt reports R hip and clavicle fx  Clinical Impression  Pt is s/p THA resulting in the deficits listed below (see PT Problem List).  Pt doing very well with PT, pain controlled, see below for mobility. Will see again for stair training and to review HEP, pt is motivated to d/c home today   Pt will benefit from acute skilled PT to increase their independence and safety with mobility to facilitate discharge.          Assistance Recommended at Discharge Intermittent Supervision/Assistance  If plan is discharge home, recommend the following:  Can travel by private vehicle  Assist for transportation;Help with stairs or ramp for entrance;Assistance with cooking/housework        Equipment Recommendations    Recommendations for Other Services       Functional Status Assessment Patient has had a recent decline in their functional status and demonstrates the ability to make significant improvements in function in a reasonable and predictable amount of time.     Precautions / Restrictions Precautions Precautions: Fall Restrictions Weight Bearing Restrictions: No Other Position/Activity Restrictions: WBAT      Mobility  Bed Mobility Overal bed mobility: Needs Assistance Bed Mobility: Supine to Sit     Supine to sit: Min assist     General bed mobility comments: assist to progress LLE off bed, pt able to self assist once Left heel off bed    Transfers Overall transfer level: Needs assistance Equipment used: Rolling walker (2 wheels) Transfers: Sit to/from Stand Sit to Stand: Min guard           General transfer comment: cues for hand placement    Ambulation/Gait Ambulation/Gait assistance: Min guard Gait Distance (Feet): 120 Feet Assistive  device: Rolling walker (2 wheels) Gait Pattern/deviations: Step-to pattern, Step-through pattern       General Gait Details: cues for sequence, beginnign step through pattern end of distance. good stability with RW, no LOB  Stairs            Wheelchair Mobility     Tilt Bed    Modified Rankin (Stroke Patients Only)       Balance Overall balance assessment: Mild deficits observed, not formally tested                                           Pertinent Vitals/Pain Pain Assessment Pain Assessment: Faces Faces Pain Scale: Hurts a little bit Pain Location: left hip Pain Descriptors / Indicators: Sore Pain Intervention(s): Limited activity within patient's tolerance, Monitored during session, Premedicated before session, Repositioned    Home Living Family/patient expects to be discharged to:: Private residence Living Arrangements: Spouse/significant other;Children Available Help at Discharge: Family Type of Home: House Home Access: Stairs to enter Entrance Stairs-Rails: Right Entrance Stairs-Number of Steps: 2   Home Layout: One level   Additional Comments: 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; dtr and son will be assisting while pt recovers    Prior Function Prior Level of Function : Independent/Modified Independent                     Hand Dominance  Extremity/Trunk Assessment   Upper Extremity Assessment Upper Extremity Assessment: Overall WFL for tasks assessed    Lower Extremity Assessment Lower Extremity Assessment: LLE deficits/detail LLE Deficits / Details: ankle grossly WFL, knee and hip grossly 3/5       Communication   Communication: No difficulties  Cognition Arousal/Alertness: Awake/alert Behavior During Therapy: WFL for tasks assessed/performed Overall Cognitive Status: Within Functional Limits for tasks assessed                                          General Comments       Exercises     Assessment/Plan    PT Assessment Patient needs continued PT services  PT Problem List Decreased strength;Decreased activity tolerance;Decreased balance;Decreased mobility;Decreased knowledge of precautions;Decreased knowledge of use of DME       PT Treatment Interventions DME instruction;Therapeutic exercise;Gait training;Functional mobility training;Therapeutic activities;Patient/family education;Stair training    PT Goals (Current goals can be found in the Care Plan section)  Acute Rehab PT Goals PT Goal Formulation: With patient Time For Goal Achievement: 07/25/23 Potential to Achieve Goals: Good    Frequency 7X/week     Co-evaluation               AM-PAC PT "6 Clicks" Mobility  Outcome Measure Help needed turning from your back to your side while in a flat bed without using bedrails?: A Little Help needed moving from lying on your back to sitting on the side of a flat bed without using bedrails?: A Little Help needed moving to and from a bed to a chair (including a wheelchair)?: A Little Help needed standing up from a chair using your arms (e.g., wheelchair or bedside chair)?: A Little Help needed to walk in hospital room?: A Little Help needed climbing 3-5 steps with a railing? : A Little 6 Click Score: 18    End of Session Equipment Utilized During Treatment: Gait belt Activity Tolerance: Patient tolerated treatment well Patient left: in chair;with call bell/phone within reach;with chair alarm set;with family/visitor present   PT Visit Diagnosis: Other abnormalities of gait and mobility (R26.89);Difficulty in walking, not elsewhere classified (R26.2)    Time: 0960-4540 PT Time Calculation (min) (ACUTE ONLY): 20 min   Charges:   PT Evaluation $PT Eval Low Complexity: 1 Low   PT General Charges $$ ACUTE PT VISIT: 1 Visit         Ashur Glatfelter, PT  Acute Rehab Dept [facility]) [phone]  07/18/2023   [facility] 07/18/2023, 11:56  AM

## 2023-07-18 NOTE — Progress Notes (Signed)
Physical Therapy Treatment Patient Details Name: Todd Koch MRN: [id number] DOB: 09/06/1946 Today's Date: 07/18/2023   History of Present Illness 77 yo male s/p L THA, AA on 07/17/23. no significant medical hx per chart; pt reports R hip and clavicle fx    PT Comments  Pt making steady progress, meeting goals. Reviewed stairs, THA HEP and progression; safety with amb as well as progression of activity and not over exerting initially. Son present throughout session. Pt is ready to d/c with family assist as needed.     Assistance Recommended at Discharge Intermittent Supervision/Assistance  If plan is discharge home, recommend the following:  Can travel by private vehicle    Assist for transportation;Help with stairs or ramp for entrance;Assistance with cooking/housework      Equipment Recommendations   (has DME)    Recommendations for Other Services       Precautions / Restrictions Precautions Precautions: Fall;Knee Restrictions Weight Bearing Restrictions: No Other Position/Activity Restrictions: WBAT     Mobility  Bed Mobility Overal bed mobility: Needs Assistance Bed Mobility: Supine to Sit     Supine to sit: Min assist     General bed mobility comments: in recliner    Transfers Overall transfer level: Needs assistance Equipment used: Rolling walker (2 wheels) Transfers: Sit to/from Stand Sit to Stand: Min guard, Supervision           General transfer comment: cues for hand placement and overall safety, keeping walker close prior to standing and before sitting    Ambulation/Gait Ambulation/Gait assistance: Supervision, Min guard Gait Distance (Feet): 80 Feet Assistive device: Rolling walker (2 wheels) Gait Pattern/deviations: Step-to pattern, Step-through pattern       General Gait Details: cues for sequence,  step through pattern end of distance. good stability with RW, no LOB   Stairs Stairs: Yes Stairs assistance: Min guard Stair  Management: One rail Right, Step to pattern, Forwards Number of Stairs: 5 (x2) General stair comments: verbal cues for sequence, and safe technique. good stablity, son present and able to cue/assist as needed   Wheelchair Mobility     Tilt Bed    Modified Rankin (Stroke Patients Only)       Balance Overall balance assessment: Mild deficits observed, not formally tested                                          Cognition Arousal/Alertness: Awake/alert Behavior During Therapy: WFL for tasks assessed/performed Overall Cognitive Status: Within Functional Limits for tasks assessed                                 General Comments: pain improved with activity        Exercises      General Comments        Pertinent Vitals/Pain Pain Assessment Pain Assessment: Faces Faces Pain Scale: Hurts even more Pain Location: left hip Pain Descriptors / Indicators: Burning, Grimacing, Sore Pain Intervention(s): Limited activity within patient's tolerance, Monitored during session, Premedicated before session, Repositioned    Home Living Family/patient expects to be discharged to:: Private residence Living Arrangements: Spouse/significant other;Children Available Help at Discharge: Family Type of Home: House Home Access: Stairs to enter Entrance Stairs-Rails: Right Entrance Stairs-Number of Steps: 2   Home Layout: One level   Additional Comments: pt is caregiver for his wife  who has dementia; dtr and son will be assisting while pt recovers    Prior Function            PT Goals (current goals can now be found in the care plan section) Acute Rehab PT Goals PT Goal Formulation: With patient Time For Goal Achievement: 07/25/23 Potential to Achieve Goals: Good Progress towards PT goals: Progressing toward goals    Frequency    7X/week      PT Plan Current plan remains appropriate    Co-evaluation              AM-PAC PT "6  Clicks" Mobility   Outcome Measure  Help needed turning from your back to your side while in a flat bed without using bedrails?: A Little Help needed moving from lying on your back to sitting on the side of a flat bed without using bedrails?: A Little Help needed moving to and from a bed to a chair (including a wheelchair)?: A Little Help needed standing up from a chair using your arms (e.g., wheelchair or bedside chair)?: A Little Help needed to walk in hospital room?: A Little Help needed climbing 3-5 steps with a railing? : A Little 6 Click Score: 18    End of Session Equipment Utilized During Treatment: Gait belt Activity Tolerance: Patient tolerated treatment well Patient left: in bed;with call bell/phone within reach;with family/visitor present (EOB)   PT Visit Diagnosis: Other abnormalities of gait and mobility (R26.89);Difficulty in walking, not elsewhere classified (R26.2)     Time: 5784-6962 PT Time Calculation (min) (ACUTE ONLY): 22 min  Charges:    $Gait Training: 8-22 mins PT General Charges $$ ACUTE PT VISIT: 1 Visit                     Novah Goza, PT  Acute Rehab Dept ([facility]) [phone]  07/18/2023    [facility] 07/18/2023, 3:20 PM

## 2023-07-18 NOTE — Progress Notes (Signed)
   07/18/23 0228  BiPAP/CPAP/SIPAP  $ Non-Invasive Home Ventilator  Initial  BiPAP/CPAP/SIPAP Pt Type Adult  BiPAP/CPAP/SIPAP DREAMSTATIOND  Mask Type  (home mask)  Patient Home Equipment No  Auto Titrate Yes (min5 max 20)

## 2023-07-18 NOTE — TOC Transition Note (Signed)
Transition of Care [facility]) - CM/SW Discharge Note  Patient Details  Name: Todd Koch MRN: [id number] Date of Birth: [birth year]  [facility]) CM/SW Contact:  Ewing Schlein, LCSW Phone Number: 07/18/2023, 12:03 PM  Clinical Narrative: Patient is expected to discharge home after working with PT. CSW met with patient and son to confirm discharge plan and needs. Patient will go home with a home exercise program (HEP). Patient has a rolling walker, but will need a 3N1. Kaitlyn with [employer] agreeable to providing 3N1, which was delivered to patient's room. TOC signing off.   Final next level of care: Home/Self Care Barriers to Discharge: No Barriers Identified  Patient Goals and CMS Choice CMS Medicare.gov Compare Post Acute Care list provided to:: Patient Choice offered to / list presented to : Patient  Discharge Plan and Services Additional resources added to the After Visit Summary for           DME Arranged: 3-N-1 DME Agency: [employer] Date DME Agency Contacted: 07/18/23 Representative spoke with at DME Agency: Yvonna Alanis  Social Determinants of Health (SDOH) Interventions SDOH Screenings   Food Insecurity: No Food Insecurity (07/17/2023)  Housing: Low Risk  (07/17/2023)  Transportation Needs: No Transportation Needs (07/17/2023)  Utilities: Not At Risk (07/17/2023)  Tobacco Use: Medium Risk (07/17/2023)   Readmission Risk Interventions     No data to display

## 2023-07-18 NOTE — Progress Notes (Signed)
   Subjective: 1 Day Post-Op Procedure(s) (LRB): TOTAL HIP ARTHROPLASTY ANTERIOR APPROACH (Left) Patient reports pain as mild.   Patient seen in rounds by Dr. Lequita Halt. Patient is well, and has had no acute complaints or problems. Denies chest pain or SOB. No issues overnight. Foley catheter removed this AM. We will begin therapy today.  Objective: Vital signs in last 24 hours: Temp:  [97.5 F (36.4 C)-98.3 F (36.8 C)] 97.5 F (36.4 C) (07/23 0448) Pulse Rate:  [61-102] 62 (07/23 0448) Resp:  [11-18] 16 (07/23 0448) BP: (98-137)/(47-75) 111/64 (07/23 0448) SpO2:  [92 %-100 %] 97 % (07/23 0735) Weight:  [84.8 kg] 84.8 kg (07/22 1020)  Intake/Output from previous day:  Intake/Output Summary (Last 24 hours) at 07/18/2023 0840 Last data filed at 07/18/2023 0724 Gross per 24 hour  Intake 2884.71 ml  Output 1900 ml  Net 984.71 ml     Intake/Output this shift: Total I/O In: -  Out: 175 [Urine:175]  Labs: Recent Labs    07/18/23 0337  HGB 10.0*   Recent Labs    07/18/23 0337  WBC 10.6*  RBC 3.27*  HCT 32.0*  PLT 201   Recent Labs    07/18/23 0337  NA 137  K 3.8  CL 107  CO2 24  BUN 15  CREATININE 0.89  GLUCOSE 184*  CALCIUM 8.5*   No results for input(s): "LABPT", "INR" in the last 72 hours.  Exam: General - Patient is Alert and Oriented Extremity - Neurologically intact Neurovascular intact Sensation intact distally Dorsiflexion/Plantar flexion intact Dressing - dressing C/D/I Motor Function - intact, moving foot and toes well on exam.   Past Medical History:  Diagnosis Date   Arthritis    Asthma    Dyspnea    Emphysema lung (HCC)    GERD (gastroesophageal reflux disease)    History of radiation therapy    Prostate cancer   Hyperlipidemia    Prostate cancer (HCC)    Skin cancer    Tachycardia     Assessment/Plan: 1 Day Post-Op Procedure(s) (LRB): TOTAL HIP ARTHROPLASTY ANTERIOR APPROACH (Left) Principal Problem:   OA (osteoarthritis)  of hip Active Problems:   Primary osteoarthritis of left hip  Estimated body mass index is 26.83 kg/m as calculated from the following:   Height as of this encounter: 5\' 10"  (1.778 m).   Weight as of this encounter: 84.8 kg. Advance diet Up with therapy D/C IV fluids  DVT Prophylaxis - Xarelto Weight bearing as tolerated. begin therapy.  Plan is to go Home after hospital stay. Plan for discharge with HEP later today if progresses with therapy and meeting goals. Follow-up in the office in 2 weeks.  The PDMP database was reviewed today prior to any opioid medications being prescribed to this patient.  Arther Abbott, PA-C Orthopedic Surgery [phone] 07/18/2023, 8:40 AM

## 2023-07-26 NOTE — Discharge Summary (Signed)
Patient ID: Todd Koch MRN: [id number] DOB/AGE: [birth year] 77 y.o.  Admit date: 07/17/2023 Discharge date: 07/18/2023  Admission Diagnoses:  Principal Problem:   OA (osteoarthritis) of hip Active Problems:   Primary osteoarthritis of left hip   Discharge Diagnoses:  Same  Past Medical History:  Diagnosis Date   Arthritis    Asthma    Dyspnea    Emphysema lung (HCC)    GERD (gastroesophageal reflux disease)    History of radiation therapy    Prostate cancer   Hyperlipidemia    Prostate cancer (HCC)    Skin cancer    Tachycardia     Surgeries: Procedure(s): TOTAL HIP ARTHROPLASTY ANTERIOR APPROACH on 07/17/2023   Consultants:   Discharged Condition: Improved  Hospital Course: Success Todd Koch is an 77 y.o. male who was admitted 07/17/2023 for operative treatment ofOA (osteoarthritis) of hip. Patient has severe unremitting pain that affects sleep, daily activities, and work/hobbies. After pre-op clearance the patient was taken to the operating room on 07/17/2023 and underwent  Procedure(s): TOTAL HIP ARTHROPLASTY ANTERIOR APPROACH.    Patient was given perioperative antibiotics:  Anti-infectives (From admission, onward)    Start     Dose/Rate Route Frequency Ordered Stop   07/17/23 1930  ceFAZolin (ANCEF) IVPB 2g/100 mL premix        2 g 200 mL/hr over 30 Minutes Intravenous Every 6 hours 07/17/23 1633 07/18/23 0216   07/17/23 1000  ceFAZolin (ANCEF) IVPB 2g/100 mL premix        2 g 200 mL/hr over 30 Minutes Intravenous On call to O.R. 07/17/23 0956 07/17/23 1320        Patient was given sequential compression devices, early ambulation, and chemoprophylaxis to prevent DVT.  Patient benefited maximally from hospital stay and there were no complications.    Recent vital signs: No data found.   Recent laboratory studies: No results for input(s): "WBC", "HGB", "HCT", "PLT", "NA", "K", "CL", "CO2", "BUN", "CREATININE", "GLUCOSE", "INR", "CALCIUM" in the last 72  hours.  Invalid input(s): "PT", "2"   Discharge Medications:   Allergies as of 07/18/2023   No Known Allergies      Medication List     TAKE these medications    albuterol 108 (90 Base) MCG/ACT inhaler Commonly known as: VENTOLIN HFA Inhale 1-2 puffs into the lungs every 6 (six) hours as needed for wheezing or shortness of breath.   atorvastatin 20 MG tablet Commonly known as: LIPITOR Take 20 mg by mouth at bedtime.   diltiazem 180 MG 24 hr capsule Commonly known as: CARDIZEM CD Take 180 mg by mouth daily.   eucerin lotion Apply 1 Application topically as needed for dry skin.   fluticasone-salmeterol 250-50 MCG/ACT Aepb Commonly known as: ADVAIR Inhale 1 puff into the lungs in the morning and at bedtime.   HYDROcodone-acetaminophen 5-325 MG tablet Commonly known as: NORCO/VICODIN Take 1-2 tablets by mouth every 8 (eight) hours as needed for severe pain.   methocarbamol 500 MG tablet Commonly known as: ROBAXIN Take 1 tablet (500 mg total) by mouth every 6 (six) hours as needed for muscle spasms.   NON FORMULARY Pt uses cpap nightly   omeprazole 40 MG capsule Commonly known as: PRILOSEC Take 40 mg by mouth in the morning, at noon, and at bedtime.   ondansetron 4 MG tablet Commonly known as: ZOFRAN Take 1 tablet (4 mg total) by mouth every 6 (six) hours as needed for nausea.   rivaroxaban 10 MG Tabs tablet Commonly known as: XARELTO Take  1 tablet (10 mg total) by mouth daily with breakfast for 20 days. Then take one 81 mg aspirin once a day for three weeks. Then discontinue aspirin.   Systane Balance 0.6 % Soln Generic drug: Propylene Glycol Place 1 drop into both eyes in the morning, at noon, in the evening, and at bedtime.   tamsulosin 0.4 MG Caps capsule Commonly known as: FLOMAX Take 0.4 mg by mouth in the morning and at bedtime.   traMADol 50 MG tablet Commonly known as: ULTRAM Take 1-2 tablets (50-100 mg total) by mouth every 6 (six) hours as  needed for moderate pain.               Discharge Care Instructions  (From admission, onward)           Start     Ordered   07/18/23 0000  Weight bearing as tolerated        07/18/23 0842   07/18/23 0000  Change dressing       Comments: You have an adhesive waterproof bandage over the incision. Leave this in place until your first follow-up appointment. Once you remove this you will not need to place another bandage.   07/18/23 0842            Diagnostic Studies: DG Pelvis Portable  Result Date: 07/17/2023 CLINICAL DATA:  Status post hip replacement. EXAM: PORTABLE PELVIS 1-2 VIEWS COMPARISON:  None Available. FINDINGS: Left hip arthroplasty in expected alignment. No periprosthetic fracture. Lucency projecting over the lesser trochanter is felt to be air in the overlying soft tissues. Recent postsurgical change includes air and edema in the soft tissues. IMPRESSION: Left hip arthroplasty without immediate postoperative complication. Electronically Signed   By: Todd Koch M.D.   On: 07/17/2023 15:32   DG HIP UNILAT WITH PELVIS 1V LEFT  Result Date: 07/17/2023 CLINICAL DATA:  Elective surgery. EXAM: DG HIP (WITH OR WITHOUT PELVIS) 1V*L* COMPARISON:  None Available. FINDINGS: Three fluoroscopic spot views of the pelvis and left hip obtained in the operating room. Images obtained during hip arthroplasty. Fluoroscopy time 6 seconds. Dose 0.5517 mGy. IMPRESSION: Intraoperative fluoroscopy during left hip arthroplasty. Electronically Signed   By: Todd Koch M.D.   On: 07/17/2023 15:31   DG C-Arm 1-60 Min-No Report  Result Date: 07/17/2023 Fluoroscopy was utilized by the requesting physician.  No radiographic interpretation.    Disposition: Discharge disposition: 01-Home or Self Care       Discharge Instructions     Call MD / Call 911   Complete by: As directed    If you experience chest pain or shortness of breath, CALL 911 and be transported to the hospital  emergency room.  If you develope a fever above 101 F, pus (white drainage) or increased drainage or redness at the wound, or calf pain, call your surgeon's office.   Change dressing   Complete by: As directed    You have an adhesive waterproof bandage over the incision. Leave this in place until your first follow-up appointment. Once you remove this you will not need to place another bandage.   Constipation Prevention   Complete by: As directed    Drink plenty of fluids.  Prune juice may be helpful.  You may use a stool softener, such as Colace (over the counter) 100 mg twice a day.  Use MiraLax (over the counter) for constipation as needed.   Diet - low sodium heart healthy   Complete by: As directed    Do  not sit on low chairs, stoools or toilet seats, as it may be difficult to get up from low surfaces   Complete by: As directed    Driving restrictions   Complete by: As directed    No driving for two weeks   Post-operative opioid taper instructions:   Complete by: As directed    POST-OPERATIVE OPIOID TAPER INSTRUCTIONS: It is important to wean off of your opioid medication as soon as possible. If you do not need pain medication after your surgery it is ok to stop day one. Opioids include: Codeine, Hydrocodone(Norco, Vicodin), Oxycodone(Percocet, oxycontin) and hydromorphone amongst others.  Long term and even short term use of opiods can cause: Increased pain response Dependence Constipation Depression Respiratory depression And more.  Withdrawal symptoms can include Flu like symptoms Nausea, vomiting And more Techniques to manage these symptoms Hydrate well Eat regular healthy meals Stay active Use relaxation techniques(deep breathing, meditating, yoga) Do Not substitute Alcohol to help with tapering If you have been on opioids for less than two weeks and do not have pain than it is ok to stop all together.  Plan to wean off of opioids This plan should start within one week  post op of your joint replacement. Maintain the same interval or time between taking each dose and first decrease the dose.  Cut the total daily intake of opioids by one tablet each day Next start to increase the time between doses. The last dose that should be eliminated is the evening dose.      TED hose   Complete by: As directed    Use stockings (TED hose) for three weeks on both leg(s).  You may remove them at night for sleeping.   Weight bearing as tolerated   Complete by: As directed         Follow-up Information     Aluisio, Homero Fellers, MD. Schedule an appointment as soon as possible for a visit in 2 week(s).   Specialty: Orthopedic Surgery Contact information: [address] [place] [address] [phone]                  Signed: Arther Abbott 07/26/2023, 2:51 PM

## 2023-08-21 ENCOUNTER — Other Ambulatory Visit (HOSPITAL_COMMUNITY): Payer: PRIVATE HEALTH INSURANCE

## 2023-10-03 DIAGNOSIS — C61 Malignant neoplasm of prostate: Secondary | ICD-10-CM | POA: Diagnosis not present

## 2023-10-03 DIAGNOSIS — R351 Nocturia: Secondary | ICD-10-CM | POA: Diagnosis not present

## 2023-11-14 DIAGNOSIS — R600 Localized edema: Secondary | ICD-10-CM | POA: Diagnosis not present

## 2023-11-14 DIAGNOSIS — H00015 Hordeolum externum left lower eyelid: Secondary | ICD-10-CM | POA: Diagnosis not present

## 2024-07-23 DIAGNOSIS — R351 Nocturia: Secondary | ICD-10-CM | POA: Diagnosis not present

## 2024-07-23 DIAGNOSIS — N529 Male erectile dysfunction, unspecified: Secondary | ICD-10-CM | POA: Diagnosis not present

## 2024-07-23 DIAGNOSIS — C61 Malignant neoplasm of prostate: Secondary | ICD-10-CM | POA: Diagnosis not present
